# Patient Record
Sex: Female | Born: 1935 | Race: White | Hispanic: No | Marital: Single | Smoking: Former smoker
Health system: Southern US, Community
[De-identification: ages and names within clinical notes are randomized; demographics above are authoritative.]

## PROBLEM LIST (undated history)

## (undated) DIAGNOSIS — E785 Hyperlipidemia, unspecified: Secondary | ICD-10-CM

## (undated) DIAGNOSIS — N816 Rectocele: Secondary | ICD-10-CM

## (undated) DIAGNOSIS — N312 Flaccid neuropathic bladder, not elsewhere classified: Secondary | ICD-10-CM

## (undated) DIAGNOSIS — N6019 Diffuse cystic mastopathy of unspecified breast: Secondary | ICD-10-CM

## (undated) DIAGNOSIS — E079 Disorder of thyroid, unspecified: Secondary | ICD-10-CM

## (undated) DIAGNOSIS — Z8639 Personal history of other endocrine, nutritional and metabolic disease: Secondary | ICD-10-CM

## (undated) DIAGNOSIS — G4700 Insomnia, unspecified: Secondary | ICD-10-CM

## (undated) DIAGNOSIS — M8580 Other specified disorders of bone density and structure, unspecified site: Secondary | ICD-10-CM

## (undated) DIAGNOSIS — I454 Nonspecific intraventricular block: Secondary | ICD-10-CM

## (undated) DIAGNOSIS — E559 Vitamin D deficiency, unspecified: Secondary | ICD-10-CM

## (undated) DIAGNOSIS — I493 Ventricular premature depolarization: Secondary | ICD-10-CM

## (undated) DIAGNOSIS — J986 Disorders of diaphragm: Secondary | ICD-10-CM

## (undated) DIAGNOSIS — K589 Irritable bowel syndrome without diarrhea: Secondary | ICD-10-CM

## (undated) DIAGNOSIS — I10 Essential (primary) hypertension: Secondary | ICD-10-CM

## (undated) DIAGNOSIS — K219 Gastro-esophageal reflux disease without esophagitis: Secondary | ICD-10-CM

## (undated) DIAGNOSIS — M1990 Unspecified osteoarthritis, unspecified site: Secondary | ICD-10-CM

## (undated) DIAGNOSIS — G839 Paralytic syndrome, unspecified: Secondary | ICD-10-CM

## (undated) HISTORY — DX: Flaccid neuropathic bladder, not elsewhere classified: N31.2

## (undated) HISTORY — DX: Irritable bowel syndrome, unspecified: K58.9

## (undated) HISTORY — PX: EYE SURGERY: SHX253

## (undated) HISTORY — DX: Unspecified osteoarthritis, unspecified site: M19.90

## (undated) HISTORY — DX: Disorders of diaphragm: J98.6

## (undated) HISTORY — DX: Other specified disorders of bone density and structure, unspecified site: M85.80

## (undated) HISTORY — DX: Vitamin D deficiency, unspecified: E55.9

## (undated) HISTORY — DX: Hyperlipidemia, unspecified: E78.5

## (undated) HISTORY — DX: Essential (primary) hypertension: I10

## (undated) HISTORY — DX: Rectocele: N81.6

## (undated) HISTORY — DX: Nonspecific intraventricular block: I45.4

## (undated) HISTORY — DX: Paralytic syndrome, unspecified: G83.9

## (undated) HISTORY — PX: BREAST FIBROADENOMA SURGERY: SHX580

## (undated) HISTORY — DX: Gastro-esophageal reflux disease without esophagitis: K21.9

## (undated) HISTORY — DX: Disorder of thyroid, unspecified: E07.9

## (undated) HISTORY — DX: Personal history of other endocrine, nutritional and metabolic disease: Z86.39

## (undated) HISTORY — DX: Insomnia, unspecified: G47.00

## (undated) HISTORY — DX: Diffuse cystic mastopathy of unspecified breast: N60.19

## (undated) HISTORY — DX: Ventricular premature depolarization: I49.3

## (undated) HISTORY — PX: VAGINAL HYSTERECTOMY: SUR661

---

## 1968-07-20 HISTORY — PX: VESICOVAGINAL FISTULA CLOSURE W/ TAH: SUR271

## 1978-07-20 HISTORY — PX: OTHER SURGICAL HISTORY: SHX169

## 1994-07-20 HISTORY — PX: OTHER SURGICAL HISTORY: SHX169

## 1997-07-20 HISTORY — PX: CATARACT EXTRACTION: SUR2

## 2000-11-03 ENCOUNTER — Other Ambulatory Visit: Admission: RE | Admit: 2000-11-03 | Discharge: 2000-11-03 | Payer: Self-pay | Admitting: Family Medicine

## 2005-06-29 ENCOUNTER — Other Ambulatory Visit: Admission: RE | Admit: 2005-06-29 | Discharge: 2005-06-29 | Payer: Self-pay | Admitting: Family Medicine

## 2006-04-21 ENCOUNTER — Ambulatory Visit: Payer: Self-pay | Admitting: Cardiology

## 2006-05-04 ENCOUNTER — Encounter: Payer: Self-pay | Admitting: Cardiology

## 2006-05-04 ENCOUNTER — Ambulatory Visit: Payer: Self-pay

## 2006-07-28 ENCOUNTER — Ambulatory Visit: Payer: Self-pay | Admitting: Cardiology

## 2007-10-18 ENCOUNTER — Ambulatory Visit: Payer: Self-pay | Admitting: Cardiology

## 2007-10-18 LAB — CONVERTED CEMR LAB
Calcium: 9.2 mg/dL (ref 8.4–10.5)
GFR calc Af Amer: 91 mL/min
Glucose, Bld: 81 mg/dL (ref 70–99)
Potassium: 3.6 meq/L (ref 3.5–5.1)
Sodium: 141 meq/L (ref 135–145)

## 2007-10-21 ENCOUNTER — Encounter: Payer: Self-pay | Admitting: Internal Medicine

## 2007-10-21 ENCOUNTER — Ambulatory Visit: Payer: Self-pay

## 2007-11-23 ENCOUNTER — Ambulatory Visit: Payer: Self-pay | Admitting: Cardiology

## 2010-10-13 ENCOUNTER — Encounter: Payer: Self-pay | Admitting: Nurse Practitioner

## 2010-10-13 DIAGNOSIS — J45909 Unspecified asthma, uncomplicated: Secondary | ICD-10-CM | POA: Insufficient documentation

## 2010-10-13 DIAGNOSIS — K589 Irritable bowel syndrome without diarrhea: Secondary | ICD-10-CM

## 2010-10-13 DIAGNOSIS — M19019 Primary osteoarthritis, unspecified shoulder: Secondary | ICD-10-CM | POA: Insufficient documentation

## 2010-10-13 DIAGNOSIS — E669 Obesity, unspecified: Secondary | ICD-10-CM | POA: Insufficient documentation

## 2010-10-13 DIAGNOSIS — G4700 Insomnia, unspecified: Secondary | ICD-10-CM | POA: Insufficient documentation

## 2010-10-13 DIAGNOSIS — I447 Left bundle-branch block, unspecified: Secondary | ICD-10-CM

## 2010-10-13 DIAGNOSIS — N816 Rectocele: Secondary | ICD-10-CM | POA: Insufficient documentation

## 2010-10-13 DIAGNOSIS — I10 Essential (primary) hypertension: Secondary | ICD-10-CM | POA: Insufficient documentation

## 2010-10-13 DIAGNOSIS — K219 Gastro-esophageal reflux disease without esophagitis: Secondary | ICD-10-CM

## 2010-11-28 ENCOUNTER — Encounter: Payer: Self-pay | Admitting: Family Medicine

## 2010-12-02 NOTE — Assessment & Plan Note (Signed)
[facility]                            CARDIOLOGY OFFICE NOTE   NAME:Betty Keller, Menter                       MRN:          [id number]  DATE:11/23/2007                            DOB:          28-Jan-1936    PRIMARY CARE PHYSICIAN:  Dr. Gennette Pac.   REASON FOR PRESENTATION:  Evaluate the patient with dyspnea, pulmonary  edema on chest x-ray.   HISTORY OF PRESENT ILLNESS:  The patient presents for follow-up of the  above.  Her recent symptoms are extensively outlined in the October 18, 2007, note.  To evaluate her dyspnea, which is an acute on chronic  problem, I sent her for stress perfusion study which demonstrated an EF  of 69%, but no evidence of ischemia or infarct.  Today, the patient  seems to be minimizing her symptoms.  She says this is a chronic  problem.  She really does not describe the acuity that she had before.  She says she has always learned to live by limiting herself when she  gets dyspneic or when she gets chest discomfort.  She is not having any  new PND or orthopnea.  Denies any palpitation, presyncope or syncope.  She denies any new chest discomfort.  Of note, I do not see the BNP  level that I had ordered.   PAST MEDICAL HISTORY:  1. Hypertension x 10 years.  2. Paralyzed right hemidiaphragm.  3. Foot surgery.  4. Back surgery.  5. Hysterectomy.  6. Hand surgery.   ALLERGIES:  VALIUM.   MEDICATIONS:  1. Lisinopril HCT 10/12.5 daily.  2. Nortriptyline 75 mg daily.  3. Ranitidine 150 mg b.i.d..   REVIEW OF SYSTEMS:  As stated in HPI, otherwise negative for other  systems.   PHYSICAL EXAMINATION:  GENERAL:  The patient is in no distress.  VITAL SIGNS:  Blood pressure 118/78, heart rate 76 and regular, weight  196 pounds, body mass index 30.  NECK:  No jugular distention, carotid upstroke brisk and symmetric.  No  bruits, thyromegaly.  LYMPHATICS:  No adenopathy.  LUNGS:  Clear to auscultation bilaterally.  BACK:   No costovertebral angle tenderness.  CHEST:  Unremarkable.  HEART:  PMI not displaced or sustained, S1-S2 within normal.  No S3-S4,  no clicks, rubs, murmurs.  ABDOMEN:  Obese, positive bowel sounds, normal in frequency and pitch,  no bruits, rebound.  No guarding or midline pulsatile mass.  No  hepatomegaly.  No splenomegaly.  SKIN:  No rashes, no nodules.  EXTREMITIES:  Pulses 2+, no edema.   ASSESSMENT/PLAN:  1. Dyspnea.  I do not see a cardiac etiology to her complaints.  She      could have some diastolic dysfunction as she had evidence of edema      on a previous chest x-ray.  I ordered a BNP level.  If this is      elevated above 100, I will suggest Lasix at least p.r.n. as she may      have some diastolic dysfunction.  2. Hypertension.  Blood pressure is well-controlled.  She will  continue medications as listed.  3. Follow-up.  Will be back in this clinic as needed.     Rollene Rotunda, MD, [facility]  Electronically Signed    JH/MedQ  DD: 11/23/2007  DT: 11/23/2007  Job #: [id number]   cc:   Bennie Pierini

## 2010-12-02 NOTE — Assessment & Plan Note (Signed)
[facility]                            CARDIOLOGY OFFICE NOTE   NAME:GANNDebralee, Betty Keller                       MRN:          [id number]  DATE:10/18/2007                            DOB:          [birth year]    PRIMARY CARE PHYSICIAN:  Bennie Pierini, NP, [facility].   REASON FOR PRESENTATION:  Evaluate patient with dyspnea and pulmonary  edema on chest x-ray.   HISTORY OF PRESENT ILLNESS:  I have seen this patient before for  evaluation of premature ventricular contractions.  She is being referred  for a new problem.  She is 75 years old.  Her past history has included  PVCs.  She has had echocardiograms, the most recent in 2007.  This  demonstrated an EF of 65% with no significant valvular abnormalities and  no regional wall motion abnormalities.  She did have some mild left  atrial dilatation.  She had been on verapamil for some time for  management of PVCs, but somewhere along the line this was stopped.  She  is now noticing increased dyspnea.  She actually was in to see [facility] yesterday and was noted apparently to have decreased oxygen  saturations on pulse ox, so I do not have these records.  The patient  also reports that a chest x-ray demonstrated some pulmonary edema.  She  has noticed some increasing dyspnea, though this has been a chronic  problem.  She has always had to have a fan blowing on her at night to  breathe.  She sleeps on two pillows, and this has not changed.  She is  not describing any new PND.  However, she says she is getting more  dyspneic with exertion.  She does walk around grocery stores but is  otherwise limited by back pain.  She is not describing chest pressure,  neck or arm discomfort.   The patient does have a feeling like her heart is beating fast and in  her head.  She noticed this Thursday.  At the same time, she noticed  increasing shortness of breath.  She has since had a heavy  feeling in  her head.  She feels a little nauseated when she turns her head in a  certain direction.  She has not had any pre-syncope or syncope, however.  She is not in particular describing irregular heartbeats.   PAST MEDICAL HISTORY:  Hypertension x 10 years, paralyzed right  hemidiaphragm.   PAST SURGICAL HISTORY:  Foot surgery, back surgery, hysterectomy, hand  surgery.   ALLERGIES:  Valium.   MEDICATIONS:  Ranitidine 150 mg b.i.d., nortriptyline 75 mg daily,  lisinopril 10/12-1/3 daily.   SOCIAL HISTORY:  The patient is married.  She has one son.  She has one  grandson and one great-grandson.  She smoked briefly 40 years ago.   FAMILY HISTORY:  Is contributory for father dying of a enlarged heart.  She does have a brother with a bypass that she is not sure at what age  he developed his heart disease.   REVIEW OF SYSTEMS:  As stated in the HPI and positive for reflux.  Negative for all other systems.   PHYSICAL EXAMINATION:  GENERAL:  The patient is in no distress.  VITAL SIGNS:  Blood pressure 122/75, heart rate 82 and regular, weight  191 pounds, body mass index 29.  HEENT:  Eyes unremarkable.  Pupils equal, round and reactive to light.  Fundi not visualized, oral mucosa unremarkable, edentulous.  NECK:  No  jugular distention, 45 degrees, carotid upstroke brisk and symmetrical.  No bruits, thyromegaly.  LYMPHATICS:  No cervical, axillary, inguinal adenopathy.  LUNGS:  Clear to auscultation bilaterally.  BACK:  No costovertebral angle pain.  CHEST:  Unremarkable.  HEART:  PMI not displaced or sustained, S1-S2 within normal.  No S3, no  S4, no clicks, no rubs, no murmurs.  ABDOMEN:  Obese, positive bowel sounds, normal in frequency and pitch,  no bruits, no rebound, no guarding, no midline pulsatile masses, no  hepatomegaly, no splenomegaly.  SKIN:  No rashes, no nodules.  EXTREMITIES:  2+ pulses throughout, dependent rubor, no cyanosis, no  clubbing, no edema.   NEURO:  Oriented to person, place and time.  Cranial nerves II-XII  grossly intact, motor grossly intact.   EKG:  Sinus rhythm, rate 76, left axis deviation, poor anterior R wave  progression, cannot exclude old anteroseptal infarct, nonspecific  anterolateral T-wave flattening.   ASSESSMENT/PLAN:  1. Dyspnea.  This has been an acute on-chronic complaint here over the      past week.  She is very limited functional capacity, being limited      by back pain, but she is also describing some dyspnea with exertion      that is worse.  She did have apparently documented hypoxemia with      decreased O2 sats on room air and edema on a chest x-ray.  Given      this, I am going to start with the BNP level and a stress perfusion      study to exclude obstructive coronary disease.  The patient would      be able to ambulate on a treadmill, so she will have to have an      adenosine Cardiolite.  2. Hypertension.  Blood pressure is controlled.  She will continue the      medications as listed.  3. Premature ventricular contractions.  She is not particularly      complaining of an irregular heartbeat, though she has had a      pounding in her head.  I am not sure why she discontinued her      verapamil was discontinued.  When I see her back, it will be in      [place], and I will have access to her primary care office records      to review this.  Since she symptomatically did well on this      medicine for years, might consider re-initiating it.  4. Followup will be in a few weeks in [place], after the above      testing.    Rollene Rotunda, MD, [facility]  Electronically Signed   JH/MedQ  DD: 10/18/2007  DT: 10/18/2007  Job #: [id number]   cc:   Gennette Pac NP Daphine Deutscher

## 2010-12-05 ENCOUNTER — Encounter: Payer: Self-pay | Admitting: Nurse Practitioner

## 2010-12-05 NOTE — Assessment & Plan Note (Signed)
[facility]                            CARDIOLOGY OFFICE NOTE   RADLEY, TESTON                         MRN:          [id number]  DATE:07/28/2006                            DOB:          [birth year]    PRIMARY CARE PHYSICIAN:  Magnus Sinning. Rice, M.D.   REASON FOR PRESENTATION:  Patient with premature ventricular  contractions and palpitations.   HISTORY OF PRESENT ILLNESS:  The patient presents for followup of the  above.  She was having some fairly frequent ventricular ectopy when I  last saw her.  She was feeling some of this, but it was not particularly  bothersome.  An echocardiogram demonstrated a well-preserved ejection  fraction and no other significant abnormalities.  She now says that she  is really noticing these even less frequently.  She certainly had no  presyncope or syncope.  She is somewhat limited in her activities  because of some orthopedic problems.  However, she tries to do some  walking at [facility] and with this she does not develop any cardiovascular  symptoms.  She does not have any symptoms at rest.  She does not have  any chest pain, shortness of breath, PND, or orthopnea.   PAST MEDICAL HISTORY:  Hypertension, paralyzed right hemidiaphragm, foot  surgery, back surgery, hysterectomy, and hand surgery.   ALLERGIES:  VALIUM.   MEDICATIONS:  1. Ranitidine 150 mg b.i.d.  2. Nortriptyline 75 mg daily.  3. Lisinopril/hydrochlorothiazide 10/12.5 daily.   REVIEW OF SYSTEMS:  As stated in the HPI otherwise negative for other  systems.   PHYSICAL EXAMINATION:  The patient is in no distress.  Blood pressure 118/78, heart rate 76 and regular.  Weight 188 pounds.  NECK:  No jugular venous distention to 45 degrees.  Carotid upstroke  brisk and symmetrical.  No bruits or thyromegaly.  LUNGS:  Clear to auscultation.  HEART:  PMI not displaced or sustained.  S1 and S2 within normal limits.  No S3, no S4, no murmurs.  EXTREMITIES:   2+ pulses, dependent rubor.   EKG:  Sinus rhythm, rate 67, left axis deviation, poor anterior R wave  progression, nonspecific T wave flattening.   ASSESSMENT AND PLAN:  Premature ventricular contractions.  These are not  particularly symptomatic.  If they do become symptomatic I have  encouraged her to come back, at which point I probably would treat her  with verapamil.  At this point she will continue with medications as  listed.   FOLLOWUP:  I will see her back as needed.     Rollene Rotunda, MD, [facility]  Electronically Signed    JH/MedQ  DD: 07/28/2006  DT: 07/28/2006  Job #: [id number]   cc:   Magnus Sinning. Rice, M.D.

## 2010-12-05 NOTE — Assessment & Plan Note (Signed)
[facility]                              CARDIOLOGY OFFICE NOTE   NAME:Betty Keller, Betty Keller                       MRN:          [id number]  DATE:04/21/2006                            DOB:          [birth year]    PRIMARY CARE PHYSICIAN:  Magnus Sinning. Rice, M.D.   REASON FOR PRESENTATION:  Evaluate patient with frequent premature  ventricular contractions and an abnormal EKG.   HISTORY OF PRESENT ILLNESS:  Patient is referred for evaluation of the  above.  She is a 75 year old female who was seeing Dr. Dimple Casey recently and was  noted to have a heart rate of 40.  I am not sure how this was recorded, as  it may have been an automated blood pressure cuff.  She was noted on EKG to  have frequent ventricular ectopy in a trigeminy pattern.  She does have poor  anterior R wave progression on her EKG as well as a leftward axis.   The patient does not notice the palpitations frequently.  Occasionally, she  will feel a little lightheaded with slightly irregular heartbeat; however,  she has not had any frank syncope or presyncope.  She notices her heart  beating hard occasionally in her head at night; however, she does not  describe any tachy palpitations.  She is limited in getting around because  of degenerative joint disease; however, in her house, she can do some  activities of daily living without severe limitations.  She will get  dyspneic with moderate exertion such as walking outside.  She does not have  any resting shortness of breath but denies any PND or orthopnea.   PAST MEDICAL HISTORY:  Hypertension x10 years, paralyzed right  hemidiaphragm.   PAST SURGICAL HISTORY:  Foot surgery, back surgery, hysterectomy, hand  surgery.   ALLERGIES:  VALIUM.   MEDICATIONS:  1. Ranitidine 150 mg b.i.d.  2. Nortriptyline 75 mg daily.  3. Lisinopril/HCT 10/12.5 mg daily.   SOCIAL HISTORY:  Patient is married.  She has one son, one grandson, and one  great-grandson.  She smoked very briefly 40 years ago.   FAMILY HISTORY:  Contributory for her father dying of an enlarged heart.  Her brother is alive with bypass surgery.  I am not sure what age.   REVIEW OF SYSTEMS:  As stated in the HPI.  Positive for intentional 100  pound weight loss over the past year.   PHYSICAL EXAMINATION:  GENERAL:  Patient is well-appearing and in no  distress.  She has a rather flat affect.  VITAL SIGNS:  Blood pressure 120/82, heart rate 80 and regular.  Body Mass  Index 29.  HEENT:  Eyelids unremarkable.  Pupils are equal, round and reactive to  light.  Fundi within normal limits.  Oral mucosa unremarkable.  NECK:  No jugular venous distention.  Wave form within normal limits.  Carotid upstroke brisk and symmetric.  No bruits, no thyromegaly.  LYMPHATICS:  No cervical, axillary, or inguinal adenopathy.  LUNGS:  Clear to auscultation bilaterally.  BACK:  No costovertebral angle tenderness.  CHEST:  Unremarkable.  HEART:  PMI not displaced or sustained.  S1 and S2 within normal limits.  No  S3, no S4.  No clicks, no rubs, no murmurs.  ABDOMEN:  Flat, positive bowel sounds.  Normal in frequency and pitch.  No  bruits, rebound, guarding.  There are no midline pulsatile masses.  No  hepatomegaly, splenomegaly.  SKIN:  No rashes, no nodules.  EXTREMITIES:  Pulses 2+ throughout.  No cyanosis, no clubbing, no edema.  NEUROLOGIC:  Alert and oriented to person, place, and time.  Cranial nerves  II-XII grossly intact.  Motor grossly intact.   EKG:  Sinus rhythm, left axis deviation, poor anterior R wave progression,  cannot exclude an old anterior MI.  No acute ST-T wave changes.   ASSESSMENT/PLAN:  1. Premature ventricular contractions:  I have reviewed her previous EKGs.      She had multiple premature ventricular contractions in a left bundle      branch block pattern.  These may be RV outflow.  She does have some      symptoms related to them, and I might  consider verapamil.  I am getting      an echocardiogram.  She did have one of these in 2002, suggesting some      right ventricular hypertrophy.  2. Followup:  I plan to see her back at about three months or sooner if      she has an abnormality on her echocardiogram or increasing symptoms.      She knows to call me should she have more palpitations, presyncope, or      certainly any syncopal episodes.            ______________________________  Rollene Rotunda, MD, [facility]     JH/MedQ  DD:  04/21/2006  DT:  04/22/2006  Job #:  [id number]   cc:   Magnus Sinning. Rice, M.D.

## 2012-04-04 ENCOUNTER — Ambulatory Visit
Admission: RE | Admit: 2012-04-04 | Discharge: 2012-04-04 | Disposition: A | Discharge status: Home / Self Care | Payer: Medicare Other | Source: Ambulatory Visit | Attending: Family Medicine | Admitting: Family Medicine

## 2012-04-04 ENCOUNTER — Other Ambulatory Visit: Payer: Self-pay | Admitting: Family Medicine

## 2012-04-04 DIAGNOSIS — R52 Pain, unspecified: Secondary | ICD-10-CM

## 2012-04-05 ENCOUNTER — Encounter: Payer: Self-pay | Admitting: Cardiology

## 2012-04-05 ENCOUNTER — Ambulatory Visit (INDEPENDENT_AMBULATORY_CARE_PROVIDER_SITE_OTHER): Payer: Medicare Other | Admitting: Cardiology

## 2012-04-05 VITALS — BP 152/75 | HR 80 | Ht 64.0 in | Wt 225.0 lb

## 2012-04-05 DIAGNOSIS — I10 Essential (primary) hypertension: Secondary | ICD-10-CM

## 2012-04-05 DIAGNOSIS — R0989 Other specified symptoms and signs involving the circulatory and respiratory systems: Secondary | ICD-10-CM

## 2012-04-05 DIAGNOSIS — R079 Chest pain, unspecified: Secondary | ICD-10-CM

## 2012-04-05 NOTE — Patient Instructions (Addendum)
The current medical regimen is effective;  continue present plan and medications.  Your physician has requested that you have a lexiscan myoview. For further information please visit [email or web address] Please follow instruction sheet, as given.  Your physician has requested that you have a carotid duplex. This test is an ultrasound of the carotid arteries in your neck. It looks at blood flow through these arteries that supply the brain with blood. Allow one hour for this exam. There are no restrictions or special instructions.  Follow up will be based on your results.

## 2012-04-05 NOTE — Progress Notes (Signed)
HPI The patient presents for evaluation of chest discomfort and a possible TIA. I have not seen her since 2009. She had chest pain at that time with a negative stress perfusion study. She presented to her primary care doctor describing an episode of her "face drawing". She thought she had a through and her high though she didn't describe any other issues such as visual or motor disturbance. She did have a CAT scan and I reviewed these results and there was no evidence of acute event. She also describes an episode of chest discomfort. This happened about a week ago. She had never had this kind of discomfort before. He was severe. It came on at rest. It lasted about 36 hours easing gradually. There was some discomfort when she took a deep breath. She did not describe neck or arm discomfort. It was a sharp pain associated with some mild nausea but no diaphoresis. She's had no palpitations, presyncope or syncope. She has some chronic dyspnea on exertion and sleeps with her head slightly elevated but this is not new. She's had no weight gain or edema. She is limited in her abilities by back pain in joint pains.  Allergies  Allergen Reactions  . Alprazolam   . Buspar (Buspirone Hcl)   . Lorazepam     Current Outpatient Prescriptions  Medication Sig Dispense Refill  . budesonide-formoterol (SYMBICORT) 160-4.5 MCG/ACT inhaler Inhale 2 puffs into the lungs 2 (two) times daily.        . Cholecalciferol (VITAMIN D) 2000 UNITS CAPS Take by mouth. One daily       . fish oil-omega-3 fatty acids 1000 MG capsule Take 2 g by mouth daily.      Marland Kitchen lisinopril-hydrochlorothiazide (PRINZIDE,ZESTORETIC) 10-12.5 MG per tablet Take 1 tablet by mouth daily.        . nortriptyline (PAMELOR) 75 MG capsule Take 75 mg by mouth at bedtime.        . simvastatin (ZOCOR) 40 MG tablet Take 40 mg by mouth at bedtime.          Past Medical History  Diagnosis Date  . GERD (gastroesophageal reflux disease)   . Esophagitis   .  Hypertension   . IBS (irritable bowel syndrome)   . Osteoarthritis     shoulders   . Paresis     rt. pelvis   . History of obesity     lost 100 lbs  . Hypotonic bladder   . Rectocele     distal ; with outlet obstruction   . Insomnia   . Asthma   . Premature ventricular contraction   . BBB (bundle branch block)     left   . Osteopenia   . Hemidiaphragm paralysis     Past Surgical History  Procedure Date  . Cataract extraction 1999  . Right carpal tunnel surgery x3 1996  . L-s spine surgery 1980  . Vesicovaginal fistula closure w/ tah 1970    Dr. Perlie Gold    . Vaginal hysterectomy     Family History  Problem Relation Age of Onset  . Heart disease Mother     Unspecified.  Died age 47  . Breast cancer Mother   . Heart attack Father     Enlarged heart.    . Hyperlipidemia Brother   . Hypertension Brother   . Breast cancer      Family History   . Coronary artery disease Brother 29    CABG    History  Social History  . Marital Status: Single    Spouse Name: N/A    Number of Children: N/A  . Years of Education: N/A   Occupational History  . Not on file.   Social History Main Topics  . Smoking status: Former Smoker    Start date: 04/05/1972  . Smokeless tobacco: Not on file  . Alcohol Use: Not on file  . Drug Use: Not on file  . Sexually Active: Not on file   Other Topics Concern  . Not on file   Social History Narrative   Lives with husband.   One child.      ROS:  Positive for joint pains, occasional constipation.  Otherwise as stated in the HPI and negative for all other systems.  PHYSICAL EXAM BP 152/75  Pulse 80  Ht 5\' 4"  (1.626 m)  Wt 225 lb (102.059 kg)  BMI 38.62 kg/m2 GENERAL:  Well appearing HEENT:  Pupils equal round and reactive, fundi not visualized, oral mucosa unremarkable NECK:  No jugular venous distention, waveform within normal limits, carotid upstroke brisk and symmetric, questionable carotid bruits, no  thyromegaly LYMPHATICS:  No cervical, inguinal adenopathy LUNGS:  Clear to auscultation bilaterally BACK:  No CVA tenderness CHEST:  Unremarkable HEART:  PMI not displaced or sustained,S1 and S2 within normal limits, no S3, no S4, no clicks, no rubs, no murmurs ABD:  Flat, positive bowel sounds normal in frequency in pitch, no bruits, no rebound, no guarding, no midline pulsatile mass, no hepatomegaly, no splenomegaly EXT:  2 plus pulses throughout, no edema, no cyanosis no clubbing SKIN:  No rashes no nodules NEURO:  Cranial nerves II through XII grossly intact, motor grossly intact throughout PSYCH:  Cognitively intact, oriented to person place and time  EKG:  Sinus rhythm, rate 80, left axis deviation, interventricular conduction delay, no acute ST-T wave changes. 04/05/2012  ASSESSMENT AND PLAN  CHEST PAIN - Her chest pain is somewhat atypical.  However, she has significant risk factors.  She needs to be screened with a stress test but would not be a walk on the treadmill. Therefore, she will have a [employer].  HTN - The blood pressure is at target. No change in medications is indicated. We will continue with therapeutic lifestyle changes (TLC).   HYPERLIPIDEMIA - I will defer to her PCP  BRUIT - The patient is convinced she had a TIA. There is a questionable carotid bruit. She will have carotid Dopplers.

## 2012-04-11 ENCOUNTER — Ambulatory Visit (HOSPITAL_COMMUNITY): Payer: Medicare Other | Attending: Cardiology | Admitting: Radiology

## 2012-04-11 VITALS — BP 90/74 | HR 77 | Ht 64.0 in | Wt 223.0 lb

## 2012-04-11 DIAGNOSIS — R0602 Shortness of breath: Secondary | ICD-10-CM

## 2012-04-11 DIAGNOSIS — R000 Tachycardia, unspecified: Secondary | ICD-10-CM | POA: Insufficient documentation

## 2012-04-11 DIAGNOSIS — R0609 Other forms of dyspnea: Secondary | ICD-10-CM | POA: Insufficient documentation

## 2012-04-11 DIAGNOSIS — R0989 Other specified symptoms and signs involving the circulatory and respiratory systems: Secondary | ICD-10-CM | POA: Insufficient documentation

## 2012-04-11 DIAGNOSIS — R079 Chest pain, unspecified: Secondary | ICD-10-CM | POA: Insufficient documentation

## 2012-04-11 DIAGNOSIS — R002 Palpitations: Secondary | ICD-10-CM | POA: Insufficient documentation

## 2012-04-11 DIAGNOSIS — I447 Left bundle-branch block, unspecified: Secondary | ICD-10-CM | POA: Insufficient documentation

## 2012-04-11 DIAGNOSIS — Z8249 Family history of ischemic heart disease and other diseases of the circulatory system: Secondary | ICD-10-CM | POA: Insufficient documentation

## 2012-04-11 MED ORDER — ADENOSINE (DIAGNOSTIC) 3 MG/ML IV SOLN
0.5600 mg/kg | Freq: Once | INTRAVENOUS | Status: AC
  Administered 2012-04-11: 56.7 mg via INTRAVENOUS

## 2012-04-11 MED ORDER — TECHNETIUM TC 99M SESTAMIBI GENERIC - CARDIOLITE
10.8000 | Freq: Once | INTRAVENOUS | Status: AC | PRN
  Administered 2012-04-11: 11 via INTRAVENOUS

## 2012-04-11 MED ORDER — TECHNETIUM TC 99M SESTAMIBI GENERIC - CARDIOLITE
33.0000 | Freq: Once | INTRAVENOUS | Status: AC | PRN
  Administered 2012-04-11: 33 via INTRAVENOUS

## 2012-04-11 MED ORDER — ADENOSINE (DIAGNOSTIC) 3 MG/ML IV SOLN: 0.8400 mg/kg | Freq: Once | INTRAVENOUS | Status: DC

## 2012-04-11 NOTE — Progress Notes (Signed)
[facility] SITE 3 NUCLEAR MED [address] [place] [address] [phone]  Cardiology Nuclear Med Study  Betty Keller is a 76 y.o. female     MRN : [id number]     DOB: 04-May-1936  Procedure Date: 04/11/2012  Nuclear Med Background Indication for Stress Test:  Evaluation for Ischemia History:  '07 Echo:EF=65%; '09 MPS:No ischeia, EF=69% Cardiac Risk Factors: Family History - CAD, History of Smoking, LBBB, Lipids, Obesity and TIA  Symptoms:  Chest Pain (last date of chest discomfort was last night), DOE, Fatigue, Nausea, Palpitations, Rapid HR and SOB   Nuclear Pre-Procedure Caffeine/Decaff Intake:  None NPO After: 9:30pm   Lungs:  Clear. O2 Sat: 94% on room air. IV 0.9% NS with Angio Cath:  22g  IV Site: R Hand  IV Started by:  Cathlyn Parsons, RN  Chest Size (in):  42 Cup Size: D  Height: 5\' 4"  (1.626 m)  Weight:  223 lb (101.152 kg)  BMI:  Body mass index is 38.28 kg/(m^2). Tech Comments:  n/a    Nuclear Med Study 1 or 2 day study: 1 day  Stress Test Type:  Adenosine  Reading MD: Cassell Clement, MD  Order Authorizing Provider:  Rollene Rotunda, MD  Resting Radionuclide: Technetium 62m Sestamibi  Resting Radionuclide Dose: 10.8 mCi   Stress Radionuclide:  Technetium 13m Sestamibi  Stress Radionuclide Dose: 33.0 mCi           Stress Protocol Rest HR: 77 Stress HR: 93  Rest BP: 90/74 Stress BP: 114/54  Exercise Time (min): n/a METS: n/a   Predicted Max HR: 146 bpm % Max HR: 63.7 bpm Rate Pressure Product: 21308   Dose of Adenosine (mg):  n/a Dose of Lexiscan: 0.4 mg  Dose of Atropine (mg): n/a Dose of Dobutamine: n/a   Stress Test Technologist: Smiley Houseman, CMA-N  Nuclear Technologist:  Domenic Polite, CNMT     Rest Procedure:  Myocardial perfusion imaging was performed at rest 45 minutes following the intravenous administration of Technetium 95m Sestamibi  Rest ECG: LBBB with occasional PVC's/PAC's.  Stress Procedure:  The  patient received IV adenosine at 140 mcg/kg/min for 4 minutes.  There were a brief episode of 2nd degree AVB, type II, and frequent PVC's with infusion.  Technetium 78m Sestamibi was injected at the 2 minute mark and quantitative spect images were obtained after a 45 minute delay.  Stress ECG: No significant change from baseline ECG  QPS Raw Data Images:  Normal; no motion artifact; normal heart/lung ratio. Stress Images:  Normal homogeneous uptake in all areas of the myocardium. Rest Images:  There is decreased uptake in the inferior wall consistent with diaphragmatic attenuation. Subtraction (SDS):  No evidence of ischemia. Transient Ischemic Dilatation (Normal <1.22):  0.90 Lung/Heart Ratio (Normal <0.45):  0.42  Quantitative Gated Spect Images QGS EDV:  64 ml QGS ESV:  32 ml  Impression Exercise Capacity:  Adenosine study with no exercise. BP Response:  Hypotensive blood pressure response. Clinical Symptoms:  No significant symptoms noted. ECG Impression:  No significant ST segment change suggestive of ischemia. Comparison with Prior Nuclear Study: No images to compare  Overall Impression:  Normal stress nuclear study.  LV Ejection Fraction: 50%.  LV Wall Motion:  NL LV Function; NL Wall Motion  [employer]

## 2012-04-12 ENCOUNTER — Encounter (INDEPENDENT_AMBULATORY_CARE_PROVIDER_SITE_OTHER): Payer: Medicare Other

## 2012-04-12 DIAGNOSIS — I6529 Occlusion and stenosis of unspecified carotid artery: Secondary | ICD-10-CM

## 2012-04-12 DIAGNOSIS — R0989 Other specified symptoms and signs involving the circulatory and respiratory systems: Secondary | ICD-10-CM

## 2012-04-18 ENCOUNTER — Telehealth: Payer: Self-pay | Admitting: Cardiology

## 2012-04-18 NOTE — Telephone Encounter (Signed)
Pt aware of results 

## 2012-04-18 NOTE — Telephone Encounter (Signed)
[phone] calling re stress test results from last week

## 2012-10-27 ENCOUNTER — Encounter: Payer: Self-pay | Admitting: *Deleted

## 2012-11-01 ENCOUNTER — Ambulatory Visit (INDEPENDENT_AMBULATORY_CARE_PROVIDER_SITE_OTHER): Payer: Medicare Other | Admitting: Family Medicine

## 2012-11-01 VITALS — BP 140/82 | HR 70 | Temp 98.9°F | Ht 64.0 in | Wt 233.0 lb

## 2012-11-01 DIAGNOSIS — E785 Hyperlipidemia, unspecified: Secondary | ICD-10-CM

## 2012-11-01 DIAGNOSIS — L0390 Cellulitis, unspecified: Secondary | ICD-10-CM

## 2012-11-01 DIAGNOSIS — E039 Hypothyroidism, unspecified: Secondary | ICD-10-CM

## 2012-11-01 DIAGNOSIS — I10 Essential (primary) hypertension: Secondary | ICD-10-CM

## 2012-11-01 DIAGNOSIS — L0291 Cutaneous abscess, unspecified: Secondary | ICD-10-CM

## 2012-11-01 DIAGNOSIS — E559 Vitamin D deficiency, unspecified: Secondary | ICD-10-CM

## 2012-11-01 LAB — HEPATIC FUNCTION PANEL
ALT: 12 U/L (ref 0–35)
AST: 12 U/L (ref 0–37)
Albumin: 3.8 g/dL (ref 3.5–5.2)
Alkaline Phosphatase: 79 U/L (ref 39–117)
Bilirubin, Direct: 0.1 mg/dL (ref 0.0–0.3)
Indirect Bilirubin: 0.2 mg/dL (ref 0.0–0.9)
Total Bilirubin: 0.3 mg/dL (ref 0.3–1.2)
Total Protein: 6.5 g/dL (ref 6.0–8.3)

## 2012-11-01 LAB — BASIC METABOLIC PANEL WITH GFR
BUN: 20 mg/dL (ref 6–23)
CO2: 31 mEq/L (ref 19–32)
Calcium: 9.6 mg/dL (ref 8.4–10.5)
Chloride: 97 mEq/L (ref 96–112)
Creat: 1.15 mg/dL — ABNORMAL HIGH (ref 0.50–1.10)
GFR, Est African American: 54 mL/min — ABNORMAL LOW
GFR, Est Non African American: 47 mL/min — ABNORMAL LOW
Glucose, Bld: 76 mg/dL (ref 70–99)
Potassium: 5 mEq/L (ref 3.5–5.3)
Sodium: 138 mEq/L (ref 135–145)

## 2012-11-01 LAB — POCT CBC
Granulocyte percent: 70.2 %G (ref 37–80)
HCT, POC: 39.8 % (ref 37.7–47.9)
Hemoglobin: 13.3 g/dL (ref 12.2–16.2)
Lymph, poc: 1.9 (ref 0.6–3.4)
MCH, POC: 29.9 pg (ref 27–31.2)
MCHC: 33.5 g/dL (ref 31.8–35.4)
MCV: 89.4 fL (ref 80–97)
MPV: 6.2 fL (ref 0–99.8)
POC Granulocyte: 5.9 (ref 2–6.9)
POC LYMPH PERCENT: 22.2 %L (ref 10–50)
Platelet Count, POC: 353 10*3/uL (ref 142–424)
RBC: 4.5 M/uL (ref 4.04–5.48)
RDW, POC: 13 %
WBC: 8.4 10*3/uL (ref 4.6–10.2)

## 2012-11-01 LAB — TSH: TSH: 2.523 u[IU]/mL (ref 0.350–4.500)

## 2012-11-01 MED ORDER — CEPHALEXIN 500 MG PO TABS: 500.0000 mg | ORAL_TABLET | Freq: Four times a day (QID) | ORAL | Status: DC

## 2012-11-01 NOTE — Progress Notes (Signed)
Patient ID: Betty Keller, female   DOB: 1936-06-10, 77 y.o.   MRN: [id number] SUBJECTIVE: HPI: Patient is here for follow up of hypertension: denies Headache;deniesChest Pain;denies weakness;denies Shortness of Breath or Orthopnea;denies Visual changes;denies palpitations;denies cough; admits to pedal edema;denies symptoms of TIA or stroke; admits to Compliance with medications. denies Problems with medications.  Also, follow up on GERD Duration:long time admits to association with meals denies elevating the head of the bed admits to eating foods that will trigger symptoms, such as tomatoes, spicy foods, alcohol, coffee. denies not having anything to eat or drink , 3 to 4 hours before bedtime denies associated with abdominal pain denies weight loss. denies associated with food sticking in the throat or chest. denies associated with vomiting. denies associated with vomiting blood denies associated with black tarry stools. denies associated with anemia. denies associated with asthma, cough or wheezing. denies associated with choking at nights. denies trying OTC medications Medications tried: denies to having an upper endoscopy. denies a specialist evaluating and treating :     PMH/PSH: reviewed/updated in Epic  SH/FH: reviewed/updated in Epic  Allergies: reviewed/updated in Epic  Medications: reviewed/updated in Epic  Immunizations: reviewed/updated in Epic  ROS: As above in the HPI. All other systems are stable or negative.  OBJECTIVE: APPEARANCE:  Patient in no acute distress.The patient appeared well nourished and normally developed. Acyanotic. Waist: VITAL SIGNS:  SKIN: warm and  Dry without overt rashes, tattoos and scars  HEAD and Neck: without JVD, Head and scalp: normal Eyes:No scleral icterus. Fundi normal, eye movements normal. Ears: Auricle normal, canal normal, Tympanic membranes normal, insufflation normal. Nose: normal Throat: normal Neck &  thyroid: normal  CHEST & LUNGS: Chest wall: normal Lungs: Clear  CVS: Reveals the PMI to be normally located. Regular rhythm, First and Second Heart sounds are normal,  absence of murmurs, rubs or gallops. Peripheral vasculature: Radial pulses: normal Dorsal pedis pulses: normal Posterior pulses: normal  ABDOMEN:  Appearance: normal Benign,, no organomegaly, no masses, no Abdominal Aortic enlargement. No Guarding , no rebound. No Bruits. Bowel sounds: normal  RECTAL: N/A GU: N/A  EXTREMETIES:  2+ edema of the feet and ankles. Erythema of the fett and ankles in a sock distribution. No heat no redness. Both Femoral and Pedal pulses are felt deminished but palpable. Nontender. Prominent varicose veins.  MUSCULOSKELETAL:  Spine: normal Joints: intact  NEUROLOGIC: oriented to time,place and person; nonfocal. Strength is normal Sensory is normal Reflexes are normal Cranial Nerves are normal.  ASSESSMENT: Cellulitis - Plan: Cephalexin 500 MG tablet, POCT CBC  HLD (hyperlipidemia) - Plan: Hepatic function panel, NMR Lipoprofile with Lipids  HTN (hypertension) - Plan: BASIC METABOLIC PANEL WITH GFR  Unspecified vitamin D deficiency - Plan: Vitamin D 25 hydroxy  Unspecified hypothyroidism - Plan: TSH  Pedal Edema:Will treat for possible underlying cellulitis, because patient thinks that she may have some cellulitis prsent. However, her main problem is venous  Stasis with dependent stasis dermatitis. She  Really needs to elevate her legs and use compression stockings.  Venous  Stasis.  PLAN: Orders Placed This Encounter  Procedures  . TSH  . BASIC METABOLIC PANEL WITH GFR  . Hepatic function panel  . NMR Lipoprofile with Lipids  . Vitamin D 25 hydroxy  . POCT CBC   Results for orders placed in visit on 11/01/12 (from the past 24 hour(s))  POCT CBC     Status: None   Collection Time    11/01/12 10:32 AM  Result Value Range   WBC 8.4  4.6 - 10.2 K/uL    Lymph, poc 1.9  0.6 - 3.4   POC LYMPH PERCENT 22.2  10 - 50 %L   POC Granulocyte 5.9  2 - 6.9   Granulocyte percent 70.2  37 - 80 %G   RBC 4.5  4.04 - 5.48 M/uL   Hemoglobin 13.3  12.2 - 16.2 g/dL   HCT, POC 65.7  84.6 - 47.9 %   MCV 89.4  80 - 97 fL   MCH, POC 29.9  27 - 31.2 pg   MCHC 33.5  31.8 - 35.4 g/dL   RDW, POC 96.2     Platelet Count, POC 353.0  142 - 424 K/uL   MPV 6.2  0 - 99.8 fL   Meds ordered this encounter  Medications  . furosemide (LASIX) 20 MG tablet    Sig: Take 20 mg by mouth daily.  Marland Kitchen levothyroxine (SYNTHROID, LEVOTHROID) 50 MCG tablet    Sig: Take 50 mcg by mouth daily before breakfast.  . Cephalexin 500 MG tablet    Sig: Take 1 tablet (500 mg total) by mouth 4 (four) times daily.    Dispense:  40 tablet    Refill:  0   Furosemide to help with BP and  Edema. Compression knee high stockings medium range pressures.#1pair refillsx3. Await the rest of her labs. Recheck in 2 weeks. Skin care . Check feet daily  Ramey Ketcherside P. [place] Charon, M.D.

## 2012-11-01 NOTE — Progress Notes (Signed)
Quick Note:   Labs/ cbc is normal. No change in plan. Await the rest of the labs ______

## 2012-11-02 LAB — NMR LIPOPROFILE WITH LIPIDS
Cholesterol, Total: 154 mg/dL (ref <200)
HDL Particle Number: 30.6 umol/L (ref >=30.5)
HDL Size: 9.9 nm (ref >=9.2)
HDL-C: 50 mg/dL (ref >=40)
LDL (calc): 89 mg/dL (ref <100)
LDL Particle Number: 1081 nmol/L — ABNORMAL HIGH (ref <1000)
LDL Size: 20.8 nm (ref >20.5)
LP-IR Score: 34 (ref <=45)
Large HDL-P: 5.9 umol/L (ref >=4.8)
Large VLDL-P: 1.7 nmol/L (ref <=2.7)
Small LDL Particle Number: 597 nmol/L — ABNORMAL HIGH (ref <=527)
Triglycerides: 75 mg/dL (ref <150)
VLDL Size: 45.9 nm (ref <=46.6)

## 2012-11-02 LAB — VITAMIN D 25 HYDROXY (VIT D DEFICIENCY, FRACTURES): Vit D, 25-Hydroxy: 19 ng/mL — ABNORMAL LOW (ref 30–89)

## 2012-11-04 NOTE — Progress Notes (Signed)
Quick Note:  Labs abnormal. Vitamin D was very low. Will need to treat with Vit D OTC at 4000 IU daily. And Recheck in 2 months. The lipids could be a bit better.dietary interventions. ______

## 2012-11-15 ENCOUNTER — Encounter: Payer: Self-pay | Admitting: Family Medicine

## 2012-11-15 ENCOUNTER — Ambulatory Visit (INDEPENDENT_AMBULATORY_CARE_PROVIDER_SITE_OTHER): Payer: Medicare Other | Admitting: Family Medicine

## 2012-11-15 VITALS — BP 125/77 | HR 70 | Temp 98.0°F | Wt 231.4 lb

## 2012-11-15 DIAGNOSIS — R600 Localized edema: Secondary | ICD-10-CM | POA: Insufficient documentation

## 2012-11-15 DIAGNOSIS — E669 Obesity, unspecified: Secondary | ICD-10-CM

## 2012-11-15 DIAGNOSIS — H6120 Impacted cerumen, unspecified ear: Secondary | ICD-10-CM

## 2012-11-15 DIAGNOSIS — H6123 Impacted cerumen, bilateral: Secondary | ICD-10-CM

## 2012-11-15 DIAGNOSIS — I872 Venous insufficiency (chronic) (peripheral): Secondary | ICD-10-CM | POA: Insufficient documentation

## 2012-11-15 DIAGNOSIS — I83893 Varicose veins of bilateral lower extremities with other complications: Secondary | ICD-10-CM

## 2012-11-15 DIAGNOSIS — I10 Essential (primary) hypertension: Secondary | ICD-10-CM

## 2012-11-15 DIAGNOSIS — K219 Gastro-esophageal reflux disease without esophagitis: Secondary | ICD-10-CM

## 2012-11-15 DIAGNOSIS — R609 Edema, unspecified: Secondary | ICD-10-CM

## 2012-11-15 NOTE — Progress Notes (Signed)
Patient ID: Betty Keller, female   DOB: [birth year], 77 y.o.   MRN: [id number] SUBJECTIVE: HPI: 1) both ears blocked up.  2) came to recheck legs and feet.: now has compression stockings and the swelling is better and the legs are less red now. Only the toes where the compression stockings end iis red. No pain. Feels fine.  PMH/PSH: reviewed/updated in Epic  SH/FH: reviewed/updated in Epic  Allergies: reviewed/updated in Epic  Medications: reviewed/updated in Epic  Immunizations: reviewed/updated in Epic  ROS: As above in the HPI. All other systems are stable or negative.  OBJECTIVE: APPEARANCE:  Patient in no acute distress.The patient appeared well nourished and normally developed. Acyanotic. Waist: Obese White Female VITAL SIGNS:BP 125/77  Pulse 70  Temp(Src) 98 F (36.7 C) (Oral)  Wt 231 lb 6.4 oz (104.962 kg)  BMI 39.7 kg/m2   SKIN: warm and  Dry without overt rashes, tattoos and scars  HEAD and Neck: without JVD, Head and scalp: normal Eyes:No scleral icterus. Fundi normal, eye movements normal.  Ears: Auricle normal, canals bilateral impacted wax. This was disempacted manually with a cerumen spoon.   Tympanic membranes normal, insufflation normal. Nose: normal Throat: normal Neck & thyroid: normal  CHEST & LUNGS: Chest wall: normal Lungs: Clear  CVS: Reveals the PMI to be normally located. Regular rhythm, First and Second Heart sounds are normal,  absence of murmurs, rubs or gallops. Peripheral vasculature: Radial pulses: normal Dorsal pedis pulses: normal Posterior pulses: normal  ABDOMEN:  Appearance:obese Benign,, no organomegaly, no masses, no Abdominal Aortic enlargement. No Guarding , no rebound. No Bruits. Bowel sounds: normal  RECTAL: N/A GU: N/A  EXTREMETIES: edematous 1+ better with compression stockings.. Both Pedal pulses are palpable.  NEUROLOGIC: oriented to time,place and person; nonfocal.   ASSESSMENT: Cerumen impaction,  bilateral  Varicose veins of lower extremities with other complications  Pedal edema  GERD (gastroesophageal reflux disease)  HTN (hypertension)  Obesity    PLAN: Manual disempaction.  Meds ordered this encounter  Medications  . neomycin-polymyxin b-dexamethasone (MAXITROL) 3.5-10000-0.1 OINT    Sig:    No orders of the defined types were placed in this encounter.    Continue present treatment. Elevate legs and use compression stockings.  RTC in 2 months Diet exercise lose weight would improve her health.  Junius Faucett P. [place] Charon, M.D.

## 2012-11-28 ENCOUNTER — Other Ambulatory Visit: Payer: Self-pay | Admitting: *Deleted

## 2012-11-28 MED ORDER — RANITIDINE HCL 150 MG PO TABS: 150.0000 mg | ORAL_TABLET | Freq: Two times a day (BID) | ORAL | Status: DC

## 2012-11-28 MED ORDER — FUROSEMIDE 20 MG PO TABS: 20.0000 mg | ORAL_TABLET | Freq: Every day | ORAL | Status: DC

## 2013-01-03 ENCOUNTER — Ambulatory Visit: Payer: Self-pay | Admitting: Nurse Practitioner

## 2013-01-24 ENCOUNTER — Encounter: Payer: Self-pay | Admitting: Family Medicine

## 2013-01-24 ENCOUNTER — Ambulatory Visit (INDEPENDENT_AMBULATORY_CARE_PROVIDER_SITE_OTHER): Payer: Medicare Other | Admitting: Family Medicine

## 2013-01-24 VITALS — BP 137/77 | HR 75 | Temp 97.2°F | Wt 233.6 lb

## 2013-01-24 DIAGNOSIS — I10 Essential (primary) hypertension: Secondary | ICD-10-CM

## 2013-01-24 DIAGNOSIS — R609 Edema, unspecified: Secondary | ICD-10-CM

## 2013-01-24 DIAGNOSIS — R635 Abnormal weight gain: Secondary | ICD-10-CM

## 2013-01-24 DIAGNOSIS — R600 Localized edema: Secondary | ICD-10-CM

## 2013-01-24 DIAGNOSIS — K589 Irritable bowel syndrome without diarrhea: Secondary | ICD-10-CM

## 2013-01-24 DIAGNOSIS — E559 Vitamin D deficiency, unspecified: Secondary | ICD-10-CM | POA: Insufficient documentation

## 2013-01-24 DIAGNOSIS — G4700 Insomnia, unspecified: Secondary | ICD-10-CM

## 2013-01-24 DIAGNOSIS — I83893 Varicose veins of bilateral lower extremities with other complications: Secondary | ICD-10-CM

## 2013-01-24 DIAGNOSIS — E669 Obesity, unspecified: Secondary | ICD-10-CM

## 2013-01-24 DIAGNOSIS — K219 Gastro-esophageal reflux disease without esophagitis: Secondary | ICD-10-CM

## 2013-01-24 NOTE — Progress Notes (Signed)
Patient ID: Betty Keller, female   DOB: 09-May-1936, 77 y.o.   MRN: [id number] SUBJECTIVE: CC: Chief Complaint  Patient presents with  . Follow-up    2 month follow up    HPI: Here for  Follow up Patient is here for follow up of hypertension/obesity/IBS/pedal edema/Varicose Veins: denies Headache;deniesChest Pain;denies weakness;denies Shortness of Breath or Orthopnea;denies Visual changes;denies palpitations;denies cough; the pedal edema is better with compression stockings;denies symptoms of TIA or stroke; admits to Compliance with medications. denies Problems with medications.   ROS: As above in the HPI. All other systems are stable or negative.  OBJECTIVE: APPEARANCE:  Patient in no acute distress.The patient appeared well nourished and normally developed. Acyanotic. Waist: VITAL SIGNS:BP 137/77  Pulse 75  Temp(Src) 97.2 F (36.2 C) (Oral)  Wt 233 lb 9.6 oz (105.96 kg)  BMI 40.08 kg/m2 [name]  SKIN: warm and  Dry without overt rashes, tattoos and scars  HEAD and Neck: without JVD, Head and scalp: normal Eyes:No scleral icterus. Fundi normal, eye movements normal. Ears: Auricle normal, canal normal, Tympanic membranes normal, insufflation normal. Nose: normal Throat: normal Neck & thyroid: normal  CHEST & LUNGS: Chest wall: normal Lungs: Clear  CVS: Reveals the PMI to be normally located. Regular rhythm, First and Second Heart sounds are normal,  absence of murmurs, rubs or gallops. Peripheral vasculature: Radial pulses: normal Dorsal pedis pulses: normal Posterior pulses: normal  ABDOMEN:  Appearance: Obese Benign, no organomegaly, no masses, no Abdominal Aortic enlargement. No Guarding , no rebound. No Bruits. Bowel sounds: normal  RECTAL: N/A GU: N/A  EXTREMETIES: edema 1= above the knee high stockings. Which only goes up 3/4 of the legs..  MUSCULOSKELETAL:  Spine: reduced ROM  NEUROLOGIC: oriented to time,place and person;  nonfocal.   ASSESSMENT: Unspecified vitamin D deficiency - Plan: Vitamin D 25 hydroxy  Varicose veins of lower extremities with other complications  Pedal edema  Obesity  Insomnia  HTN (hypertension)  IBS (irritable bowel syndrome)  GERD (gastroesophageal reflux disease)    PLAN: Manual Rx for thigh high compression stockings: medium range pressures #1 pair refill x3 . Orders Placed This Encounter  Procedures  . Vitamin D 25 hydroxy   Continue the same  meds at this time. Would not incraese the diuretic at this time due to electrolyte disturbance risks,  Return in about 2 months (around 03/27/2013) for Recheck medical problems.  Betty Keller P. [place]esto Charon, M.D.

## 2013-01-25 LAB — VITAMIN D 25 HYDROXY (VIT D DEFICIENCY, FRACTURES): Vit D, 25-Hydroxy: 37 ng/mL (ref 30–89)

## 2013-01-27 NOTE — Progress Notes (Signed)
Quick Note:  Lab result at goal. No change in Medications for now. No Change in plans and follow up. ______ 

## 2013-03-28 ENCOUNTER — Encounter: Payer: Self-pay | Admitting: Family Medicine

## 2013-03-28 ENCOUNTER — Ambulatory Visit (INDEPENDENT_AMBULATORY_CARE_PROVIDER_SITE_OTHER): Payer: Medicare Other | Admitting: Family Medicine

## 2013-03-28 VITALS — BP 135/80 | HR 82 | Temp 98.6°F | Ht 64.0 in | Wt 231.0 lb

## 2013-03-28 DIAGNOSIS — E785 Hyperlipidemia, unspecified: Secondary | ICD-10-CM

## 2013-03-28 DIAGNOSIS — E669 Obesity, unspecified: Secondary | ICD-10-CM

## 2013-03-28 DIAGNOSIS — E039 Hypothyroidism, unspecified: Secondary | ICD-10-CM

## 2013-03-28 DIAGNOSIS — R609 Edema, unspecified: Secondary | ICD-10-CM

## 2013-03-28 DIAGNOSIS — J069 Acute upper respiratory infection, unspecified: Secondary | ICD-10-CM

## 2013-03-28 DIAGNOSIS — I83893 Varicose veins of bilateral lower extremities with other complications: Secondary | ICD-10-CM

## 2013-03-28 DIAGNOSIS — G4700 Insomnia, unspecified: Secondary | ICD-10-CM

## 2013-03-28 DIAGNOSIS — K219 Gastro-esophageal reflux disease without esophagitis: Secondary | ICD-10-CM

## 2013-03-28 DIAGNOSIS — M19019 Primary osteoarthritis, unspecified shoulder: Secondary | ICD-10-CM

## 2013-03-28 DIAGNOSIS — K589 Irritable bowel syndrome without diarrhea: Secondary | ICD-10-CM

## 2013-03-28 DIAGNOSIS — E559 Vitamin D deficiency, unspecified: Secondary | ICD-10-CM

## 2013-03-28 DIAGNOSIS — I447 Left bundle-branch block, unspecified: Secondary | ICD-10-CM

## 2013-03-28 DIAGNOSIS — R600 Localized edema: Secondary | ICD-10-CM

## 2013-03-28 DIAGNOSIS — I10 Essential (primary) hypertension: Secondary | ICD-10-CM

## 2013-03-28 NOTE — Progress Notes (Signed)
Patient ID: Betty Keller, female   DOB: [birth year], 77 y.o.   MRN: [id number] SUBJECTIVE: CC: Chief Complaint  Patient presents with  . Hypertension  . Hyperlipidemia  . Hypothyroidism  . Gastrophageal Reflux    HPI: Hoarse, congested, has the bug that's going around.no fever,  Patient is here for follow up of hyperlipidemia/HTN/hypothyroid denies Headache;denies Chest Pain;denies weakness;denies Shortness of Breath and orthopnea;denies Visual changes;denies palpitations;denies cough; pedal edema better denies symptoms of TIA or stroke;deniesClaudication symptoms. admits to Compliance with medications; denies Problems with medications.    Past Medical History  Diagnosis Date  . GERD (gastroesophageal reflux disease)   . Esophagitis   . Hypertension   . IBS (irritable bowel syndrome)   . Osteoarthritis     shoulders   . Paresis     rt. pelvis   . History of obesity     lost 100 lbs  . Hypotonic bladder   . Rectocele     distal ; with outlet obstruction   . Insomnia   . Asthma   . Premature ventricular contraction   . BBB (bundle branch block)     left   . Osteopenia   . Hemidiaphragm paralysis   . Vitamin D deficiency   . Hyperlipidemia    Past Surgical History  Procedure Laterality Date  . Cataract extraction  1999  . Right carpal tunnel surgery x3  1996  . L-s spine surgery  1980  . Vesicovaginal fistula closure w/ tah  1970    Dr. Perlie Gold    . Vaginal hysterectomy     History   Social History  . Marital Status: Single    Spouse Name: N/A    Number of Children: N/A  . Years of Education: N/A   Occupational History  . Not on file.   Social History Main Topics  . Smoking status: Former Smoker    Start date: 04/05/1972  . Smokeless tobacco: Not on file  . Alcohol Use: No  . Drug Use: No  . Sexual Activity: Not on file   Other Topics Concern  . Not on file   Social History Narrative   Lives with husband.   One child.     Family History   Problem Relation Age of Onset  . Heart disease Mother     Unspecified.  Died age 8  . Breast cancer Mother   . Heart attack Father     Enlarged heart.    . Hyperlipidemia Brother   . Hypertension Brother   . Breast cancer      Family History   . Coronary artery disease Brother 58    CABG   Current Outpatient Prescriptions on File Prior to Visit  Medication Sig Dispense Refill  . budesonide-formoterol (SYMBICORT) 160-4.5 MCG/ACT inhaler Inhale 2 puffs into the lungs 2 (two) times daily.        . Cholecalciferol (VITAMIN D) 2000 UNITS CAPS Take 4,000 Units by mouth daily. One daily      . fish oil-omega-3 fatty acids 1000 MG capsule Take 2 g by mouth daily.      . furosemide (LASIX) 20 MG tablet Take 1 tablet (20 mg total) by mouth daily.  90 tablet  0  . levothyroxine (SYNTHROID, LEVOTHROID) 50 MCG tablet Take 50 mcg by mouth daily before breakfast.      . lisinopril-hydrochlorothiazide (PRINZIDE,ZESTORETIC) 10-12.5 MG per tablet Take 1 tablet by mouth daily.        . nortriptyline (PAMELOR) 75 MG capsule  Take 75 mg by mouth at bedtime.        . ranitidine (ZANTAC) 150 MG tablet Take 1 tablet (150 mg total) by mouth 2 (two) times daily.  180 tablet  0  . simvastatin (ZOCOR) 40 MG tablet Take 40 mg by mouth at bedtime.         No current facility-administered medications on file prior to visit.   Allergies  Allergen Reactions  . Alprazolam   . Buspar [Buspirone Hcl]   . Lorazepam    Immunization History  Administered Date(s) Administered  . Influenza Whole 04/17/2010  . Td 11/18/2002   Prior to Admission medications   Medication Sig Start Date End Date Taking? Authorizing Provider  budesonide-formoterol (SYMBICORT) 160-4.5 MCG/ACT inhaler Inhale 2 puffs into the lungs 2 (two) times daily.     Yes Historical Provider, MD  Cholecalciferol (VITAMIN D) 2000 UNITS CAPS Take 4,000 Units by mouth daily. One daily   Yes Historical Provider, MD  fish oil-omega-3 fatty acids 1000 MG  capsule Take 2 g by mouth daily.   Yes Historical Provider, MD  furosemide (LASIX) 20 MG tablet Take 1 tablet (20 mg total) by mouth daily. 11/28/12  Yes Ileana Ladd, MD  levothyroxine (SYNTHROID, LEVOTHROID) 50 MCG tablet Take 50 mcg by mouth daily before breakfast.   Yes Historical Provider, MD  lisinopril-hydrochlorothiazide (PRINZIDE,ZESTORETIC) 10-12.5 MG per tablet Take 1 tablet by mouth daily.     Yes Historical Provider, MD  nortriptyline (PAMELOR) 75 MG capsule Take 75 mg by mouth at bedtime.     Yes Historical Provider, MD  ranitidine (ZANTAC) 150 MG tablet Take 1 tablet (150 mg total) by mouth 2 (two) times daily. 11/28/12  Yes Ileana Ladd, MD  simvastatin (ZOCOR) 40 MG tablet Take 40 mg by mouth at bedtime.     Yes Historical Provider, MD      ROS: As above in the HPI. All other systems are stable or negative.  OBJECTIVE: APPEARANCE:  Patient in no acute distress.The patient appeared well nourished and normally developed. Acyanotic. Waist: VITAL SIGNS:BP 135/80  Pulse 82  Temp(Src) 98.6 F (37 C) (Oral)  Ht 5\' 4"  (1.626 m)  Wt 231 lb (104.781 kg)  BMI 39.63 kg/m2 WF obese  SKIN: warm and  Dry without overt rashes, tattoos and scars  HEAD and Neck: without JVD, Head and scalp: normal Eyes:No scleral icterus. Fundi normal, eye movements normal. Ears: Auricle normal, canal normal, Tympanic membranes normal, insufflation normal. Nose: nasal congestion Throat: hoarse Neck & thyroid: normal  CHEST & LUNGS: Chest wall: normal Lungs: Clear  CVS: Reveals the PMI to be normally located. Regular rhythm, First and Second Heart sounds are normal,  absence of murmurs, rubs or gallops. Peripheral vasculature: Radial pulses: normal Dorsal pedis pulses: normal Posterior pulses: normal  ABDOMEN:  Appearance: obese Benign, no organomegaly, no masses, no Abdominal Aortic enlargement. No Guarding , no rebound. No Bruits. Bowel sounds: normal  RECTAL: N/A GU:  N/A  EXTREMETIES: trace edema  NEUROLOGIC: oriented to time,place and person; nonfocal. Strength is normal Sensory is normal Reflexes are normal Cranial Nerves are normal.  ASSESSMENT: GERD (gastroesophageal reflux disease)  HTN (hypertension) - Plan: CMP14+EGFR  IBS (irritable bowel syndrome)  Insomnia  Obesity  Osteoarthritis of shoulder, unspecified laterality  Pedal edema  Unspecified vitamin D deficiency  Varicose veins of lower extremities with other complications  Hyperlipidemia - Plan: CMP14+EGFR, NMR, lipoprofile  Bundle branch block, left  Acute upper respiratory infections of unspecified site  Hypothyroid - Plan: TSH   PLAN:  Orders Placed This Encounter  Procedures  . CMP14+EGFR  . NMR, lipoprofile  . TSH   Results for orders placed in visit on 01/24/13  VITAMIN D 25 HYDROXY      Result Value Range   Vit D, 25-Hydroxy 37  30 - 89 ng/mL   Previous labs reviewed.  Diet and exercise  Reviewed.  fluids observe viral URI. No indications for antibiotics.  Return in about 3 months (around 06/27/2013) for Recheck medical problems.  Trudie Cervantes P. [place] Charon, M.D.

## 2013-03-30 LAB — NMR, LIPOPROFILE
Cholesterol: 162 mg/dL (ref <200)
HDL Cholesterol by NMR: 61 mg/dL (ref >=40)
HDL Particle Number: 40.2 umol/L (ref >=30.5)
LDL Particle Number: 1148 nmol/L — ABNORMAL HIGH (ref <1000)
LDL Size: 21 nm (ref >20.5)
LDLC SERPL CALC-MCNC: 81 mg/dL (ref <100)
LP-IR Score: 44 (ref <=45)
Small LDL Particle Number: 574 nmol/L — ABNORMAL HIGH (ref <=527)
Triglycerides by NMR: 99 mg/dL (ref <150)

## 2013-03-30 LAB — CMP14+EGFR
ALT: 20 IU/L (ref 0–32)
AST: 21 IU/L (ref 0–40)
Albumin/Globulin Ratio: 1.7 (ref 1.1–2.5)
Albumin: 4.2 g/dL (ref 3.5–4.8)
Alkaline Phosphatase: 93 IU/L (ref 39–117)
BUN/Creatinine Ratio: 12 (ref 11–26)
BUN: 12 mg/dL (ref 8–27)
CO2: 23 mmol/L (ref 18–29)
Calcium: 9.4 mg/dL (ref 8.6–10.2)
Chloride: 95 mmol/L — ABNORMAL LOW (ref 97–108)
Creatinine, Ser: 0.99 mg/dL (ref 0.57–1.00)
GFR calc Af Amer: 64 mL/min/{1.73_m2} (ref >59)
GFR calc non Af Amer: 56 mL/min/{1.73_m2} — ABNORMAL LOW (ref >59)
Globulin, Total: 2.5 g/dL (ref 1.5–4.5)
Glucose: 86 mg/dL (ref 65–99)
Potassium: 4.4 mmol/L (ref 3.5–5.2)
Sodium: 140 mmol/L (ref 134–144)
Total Bilirubin: 0.2 mg/dL (ref 0.0–1.2)
Total Protein: 6.7 g/dL (ref 6.0–8.5)

## 2013-03-30 LAB — TSH: TSH: 2.21 u[IU]/mL (ref 0.450–4.500)

## 2013-04-03 ENCOUNTER — Encounter: Payer: Self-pay | Admitting: *Deleted

## 2013-05-30 ENCOUNTER — Encounter: Payer: Self-pay | Admitting: Cardiology

## 2013-05-30 ENCOUNTER — Ambulatory Visit (HOSPITAL_COMMUNITY): Payer: Medicare Other | Attending: Cardiology

## 2013-05-30 DIAGNOSIS — I658 Occlusion and stenosis of other precerebral arteries: Secondary | ICD-10-CM | POA: Insufficient documentation

## 2013-05-30 DIAGNOSIS — E785 Hyperlipidemia, unspecified: Secondary | ICD-10-CM | POA: Insufficient documentation

## 2013-05-30 DIAGNOSIS — Z87891 Personal history of nicotine dependence: Secondary | ICD-10-CM | POA: Insufficient documentation

## 2013-05-30 DIAGNOSIS — I10 Essential (primary) hypertension: Secondary | ICD-10-CM | POA: Insufficient documentation

## 2013-05-30 DIAGNOSIS — I6529 Occlusion and stenosis of unspecified carotid artery: Secondary | ICD-10-CM | POA: Insufficient documentation

## 2013-06-27 ENCOUNTER — Ambulatory Visit (INDEPENDENT_AMBULATORY_CARE_PROVIDER_SITE_OTHER): Payer: Medicare Other

## 2013-06-27 ENCOUNTER — Encounter: Payer: Self-pay | Admitting: Family Medicine

## 2013-06-27 ENCOUNTER — Ambulatory Visit (INDEPENDENT_AMBULATORY_CARE_PROVIDER_SITE_OTHER): Payer: Medicare Other | Admitting: Family Medicine

## 2013-06-27 VITALS — BP 128/81 | HR 85 | Temp 98.4°F | Ht 64.0 in | Wt 232.4 lb

## 2013-06-27 DIAGNOSIS — E079 Disorder of thyroid, unspecified: Secondary | ICD-10-CM | POA: Insufficient documentation

## 2013-06-27 DIAGNOSIS — M19019 Primary osteoarthritis, unspecified shoulder: Secondary | ICD-10-CM

## 2013-06-27 DIAGNOSIS — I447 Left bundle-branch block, unspecified: Secondary | ICD-10-CM

## 2013-06-27 DIAGNOSIS — R0989 Other specified symptoms and signs involving the circulatory and respiratory systems: Secondary | ICD-10-CM | POA: Insufficient documentation

## 2013-06-27 DIAGNOSIS — I10 Essential (primary) hypertension: Secondary | ICD-10-CM

## 2013-06-27 DIAGNOSIS — E785 Hyperlipidemia, unspecified: Secondary | ICD-10-CM

## 2013-06-27 DIAGNOSIS — E559 Vitamin D deficiency, unspecified: Secondary | ICD-10-CM

## 2013-06-27 DIAGNOSIS — E669 Obesity, unspecified: Secondary | ICD-10-CM

## 2013-06-27 DIAGNOSIS — R609 Edema, unspecified: Secondary | ICD-10-CM

## 2013-06-27 DIAGNOSIS — J449 Chronic obstructive pulmonary disease, unspecified: Secondary | ICD-10-CM | POA: Insufficient documentation

## 2013-06-27 DIAGNOSIS — I83893 Varicose veins of bilateral lower extremities with other complications: Secondary | ICD-10-CM

## 2013-06-27 DIAGNOSIS — K219 Gastro-esophageal reflux disease without esophagitis: Secondary | ICD-10-CM

## 2013-06-27 DIAGNOSIS — R600 Localized edema: Secondary | ICD-10-CM

## 2013-06-27 LAB — POCT CBC
Granulocyte percent: 74.6 %G (ref 37–80)
HCT, POC: 48.5 % — AB (ref 37.7–47.9)
Hemoglobin: 15.2 g/dL (ref 12.2–16.2)
Lymph, poc: 2.3 (ref 0.6–3.4)
MCH, POC: 28.7 pg (ref 27–31.2)
MCHC: 31.4 g/dL — AB (ref 31.8–35.4)
MCV: 91.4 fL (ref 80–97)
MPV: 6.9 fL (ref 0–99.8)
POC Granulocyte: 8.4 — AB (ref 2–6.9)
POC LYMPH PERCENT: 20.5 %L (ref 10–50)
Platelet Count, POC: 279 10*3/uL (ref 142–424)
RBC: 5.3 M/uL (ref 4.04–5.48)
RDW, POC: 13.8 %
WBC: 11.3 10*3/uL — AB (ref 4.6–10.2)

## 2013-06-27 MED ORDER — LEVOFLOXACIN 500 MG PO TABS: 500.0000 mg | ORAL_TABLET | Freq: Every day | ORAL | Status: DC

## 2013-06-27 MED ORDER — IPRATROPIUM-ALBUTEROL 0.5-2.5 (3) MG/3ML IN SOLN
3.0000 mL | Freq: Once | RESPIRATORY_TRACT | Status: AC
  Administered 2013-06-27: 3 mL via RESPIRATORY_TRACT

## 2013-06-27 NOTE — Progress Notes (Signed)
Patient ID: Betty Keller, female   DOB: [birth year], 77 y.o.   MRN: [id number] SUBJECTIVE: CC: Chief Complaint  Patient presents with  . Follow-up    3 MONTH FOLLOW UP  cough congestion wanted to be checked before cxr    HPI: Has had chest congestion and rattling in the chest the last couple of days with wheezing. Ex-smoker with h/o lung problems. No fever. Has inhalers but hasn't used them.no orthopnea.  Patient is here for follow up of hyperlipidemia/HTN/Obesity/GERD: denies Headache;denies Chest Pain;denies weakness;;denies Visual changes;denies palpitations;denies cough;denies pedal edema;denies symptoms of TIA or stroke;deniesClaudication symptoms. admits to Compliance with medications; denies Problems with medications.    Past Medical History  Diagnosis Date  . GERD (gastroesophageal reflux disease)   . Esophagitis   . Hypertension   . IBS (irritable bowel syndrome)   . Osteoarthritis     shoulders   . Paresis     rt. pelvis   . History of obesity     lost 100 lbs  . Hypotonic bladder   . Rectocele     distal ; with outlet obstruction   . Insomnia   . Asthma   . Premature ventricular contraction   . BBB (bundle branch block)     left   . Osteopenia   . Hemidiaphragm paralysis   . Vitamin D deficiency   . Hyperlipidemia   . Thyroid disease     hypothyroidism   Past Surgical History  Procedure Laterality Date  . Cataract extraction  1999  . Right carpal tunnel surgery x3  1996  . L-s spine surgery  1980  . Vesicovaginal fistula closure w/ tah  1970    Dr. Perlie Gold    . Vaginal hysterectomy     History   Social History  . Marital Status: Single    Spouse Name: N/A    Number of Children: N/A  . Years of Education: N/A   Occupational History  . Not on file.   Social History Main Topics  . Smoking status: Former Smoker    Start date: 04/05/1972  . Smokeless tobacco: Not on file  . Alcohol Use: No  . Drug Use: No  . Sexual Activity: Not on file    Other Topics Concern  . Not on file   Social History Narrative   Lives with husband.   One child.     Family History  Problem Relation Age of Onset  . Heart disease Mother     Unspecified.  Died age 96  . Breast cancer Mother   . Heart attack Father     Enlarged heart.    . Hyperlipidemia Brother   . Hypertension Brother   . Breast cancer      Family History   . Coronary artery disease Brother 75    CABG   Current Outpatient Prescriptions on File Prior to Visit  Medication Sig Dispense Refill  . budesonide-formoterol (SYMBICORT) 160-4.5 MCG/ACT inhaler Inhale 2 puffs into the lungs 2 (two) times daily.        . Cholecalciferol (VITAMIN D) 2000 UNITS CAPS Take 4,000 Units by mouth daily. One daily      . fish oil-omega-3 fatty acids 1000 MG capsule Take 2 g by mouth daily.      . furosemide (LASIX) 20 MG tablet Take 1 tablet (20 mg total) by mouth daily.  90 tablet  0  . levothyroxine (SYNTHROID, LEVOTHROID) 50 MCG tablet Take 50 mcg by mouth daily before breakfast.      .  lisinopril-hydrochlorothiazide (PRINZIDE,ZESTORETIC) 10-12.5 MG per tablet Take 1 tablet by mouth daily.        . nortriptyline (PAMELOR) 75 MG capsule Take 75 mg by mouth at bedtime.        . ranitidine (ZANTAC) 150 MG tablet Take 1 tablet (150 mg total) by mouth 2 (two) times daily.  180 tablet  0  . simvastatin (ZOCOR) 40 MG tablet Take 40 mg by mouth at bedtime.         No current facility-administered medications on file prior to visit.   Allergies  Allergen Reactions  . Alprazolam   . Buspar [Buspirone Hcl]   . Lorazepam    Immunization History  Administered Date(s) Administered  . Influenza Whole 04/17/2010  . Td 11/18/2002   Prior to Admission medications   Medication Sig Start Date End Date Taking? Authorizing Provider  budesonide-formoterol (SYMBICORT) 160-4.5 MCG/ACT inhaler Inhale 2 puffs into the lungs 2 (two) times daily.     Yes Historical Provider, MD  Cholecalciferol (VITAMIN D)  2000 UNITS CAPS Take 4,000 Units by mouth daily. One daily   Yes Historical Provider, MD  fish oil-omega-3 fatty acids 1000 MG capsule Take 2 g by mouth daily.   Yes Historical Provider, MD  furosemide (LASIX) 20 MG tablet Take 1 tablet (20 mg total) by mouth daily. 11/28/12  Yes Ileana Ladd, MD  levothyroxine (SYNTHROID, LEVOTHROID) 50 MCG tablet Take 50 mcg by mouth daily before breakfast.   Yes Historical Provider, MD  lisinopril-hydrochlorothiazide (PRINZIDE,ZESTORETIC) 10-12.5 MG per tablet Take 1 tablet by mouth daily.     Yes Historical Provider, MD  nortriptyline (PAMELOR) 75 MG capsule Take 75 mg by mouth at bedtime.     Yes Historical Provider, MD  ranitidine (ZANTAC) 150 MG tablet Take 1 tablet (150 mg total) by mouth 2 (two) times daily. 11/28/12  Yes Ileana Ladd, MD  simvastatin (ZOCOR) 40 MG tablet Take 40 mg by mouth at bedtime.     Yes Historical Provider, MD     ROS: As above in the HPI. All other systems are stable or negative.  OBJECTIVE: APPEARANCE:  Patient in no acute distress.The patient appeared well nourished and normally developed. Acyanotic. Waist: VITAL SIGNS:BP 128/81  Pulse 85  Temp(Src) 98.4 F (36.9 C) (Oral)  Ht 5\' 4"  (1.626 m)  Wt 232 lb 6.4 oz (105.416 kg)  BMI 39.87 kg/m2  SpO2 92%  later 92%  SKIN: warm and  Dry without overt rashes, tattoos and scars  HEAD and Neck: without JVD, Head and scalp: normal Eyes:No scleral icterus. Fundi normal, eye movements normal. Ears: Auricle normal, canal normal, Tympanic membranes normal, insufflation normal. Nose: normal Throat: normal Neck & thyroid: normal  CHEST & LUNGS: Chest wall: normal Lungs: Coarse bs, rales and crackles in bases. Rhonchi decreased bs in bases. Rattling cough  CVS: Reveals the PMI to be normally located. Regular rhythm, First and Second Heart sounds are normal,  absence of murmurs, rubs or gallops. Peripheral vasculature: Radial pulses: normal Dorsal pedis pulses:  normal Posterior pulses: normal  ABDOMEN:  Appearance: normal Benign, no organomegaly, no masses, no Abdominal Aortic enlargement. No Guarding , no rebound. No Bruits. Bowel sounds: normal  RECTAL: N/A GU: N/A  EXTREMETIES: nonedematous.  MUSCULOSKELETAL:  Spine: normal Joints: intact  NEUROLOGIC: oriented to time,place and person; nonfocal. Strength is normal Sensory is normal Reflexes are normal Cranial Nerves are normal.  Results for orders placed in visit on 06/27/13  POCT CBC  Result Value Range   WBC 11.3 (*) 4.6 - 10.2 K/uL   Lymph, poc 2.3  0.6 - 3.4   POC LYMPH PERCENT 20.5  10 - 50 %L   MID (cbc)    0 - 0.9   POC MID %    0 - 12 %M   POC Granulocyte 8.4 (*) 2 - 6.9   Granulocyte percent 74.6  37 - 80 %G   RBC 5.3  4.04 - 5.48 M/uL   Hemoglobin 15.2  12.2 - 16.2 g/dL   HCT, POC 16.1 (*) 09.6 - 47.9 %   MCV 91.4  80 - 97 fL   MCH, POC 28.7  27 - 31.2 pg   MCHC 31.4 (*) 31.8 - 35.4 g/dL   RDW, POC 04.5     Platelet Count, POC 279.0  142 - 424 K/uL   MPV 6.9  0 - 99.8 fL     ASSESSMENT:  COPD (chronic obstructive pulmonary disease) - Plan: levofloxacin (LEVAQUIN) 500 MG tablet, roflumilast (DALIRESP) 500 MCG TABS tablet  Pedal edema  Obesity  Hyperlipidemia - Plan: CMP14+EGFR, NMR, lipoprofile  HTN (hypertension) - Plan: CMP14+EGFR  Bundle branch block, left  GERD (gastroesophageal reflux disease)  Osteoarthritis of shoulder, unspecified laterality  Unspecified vitamin D deficiency  Varicose veins of lower extremities with other complications  Chest congestion - Plan: Brain natriuretic peptide, DG Chest 2 View, POCT CBC, ipratropium-albuterol (DUONEB) 0.5-2.5 (3) MG/3ML nebulizer solution 3 mL  Thyroid disease - Plan: TSH  Patient has an exacerbation of COPD/ bronchitis.  PLAN: [facility] reading (PRIMARY) by  Dr. Modesto Charon: poor inflation. Atelectasis in basis                              CLINICAL DATA: Chest congestion.  EXAM:  CHEST 2  VIEW  COMPARISON: None.  FINDINGS:  The heart size and mediastinal contours are within normal limits.  Hypoinflation of the lungs is noted. Minimal subsegmental  atelectasis is seen in both lung bases. No pleural effusion or  pneumothorax is noted. The visualized skeletal structures are  unremarkable.  IMPRESSION:  Minimal bilateral basilar subsegmental atelectasis. Hypoinflation of  the lungs.  Electronically Signed  By: Roque Lias M.D.  On: 06/27/2013 14:40    Orders Placed This Encounter  Procedures  . DG Chest 2 View    Standing Status: Future     Number of Occurrences: 1     Standing Expiration Date: 08/28/2014    Order Specific Question:  Reason for Exam (SYMPTOM  OR DIAGNOSIS REQUIRED)    Answer:  chest congestion    Order Specific Question:  Preferred imaging location?    Answer:  Internal  . CMP14+EGFR  . NMR, lipoprofile  . TSH  . Brain natriuretic peptide  . POCT CBC   Meds ordered this encounter  Medications  . ipratropium-albuterol (DUONEB) 0.5-2.5 (3) MG/3ML nebulizer solution 3 mL    Sig:   . levofloxacin (LEVAQUIN) 500 MG tablet    Sig: Take 1 tablet (500 mg total) by mouth daily.    Dispense:  10 tablet    Refill:  0  . roflumilast (DALIRESP) 500 MCG TABS tablet    Sig: Take 1 tablet (500 mcg total) by mouth daily.    Dispense:  14 tablet    Refill:  0   There are no discontinued medications. Return in about 2 days (around 06/29/2013) for Recheck medical problems.  Meggin Ola P. [place] Charon, M.D.

## 2013-06-28 ENCOUNTER — Ambulatory Visit: Payer: Medicare Other | Admitting: Family Medicine

## 2013-06-29 ENCOUNTER — Encounter: Payer: Self-pay | Admitting: Family Medicine

## 2013-06-29 ENCOUNTER — Ambulatory Visit (INDEPENDENT_AMBULATORY_CARE_PROVIDER_SITE_OTHER): Payer: Medicare Other | Admitting: Family Medicine

## 2013-06-29 VITALS — BP 127/73 | HR 85 | Temp 98.5°F | Ht 64.0 in | Wt 233.2 lb

## 2013-06-29 DIAGNOSIS — J440 Chronic obstructive pulmonary disease with acute lower respiratory infection: Secondary | ICD-10-CM

## 2013-06-29 DIAGNOSIS — R600 Localized edema: Secondary | ICD-10-CM

## 2013-06-29 DIAGNOSIS — J209 Acute bronchitis, unspecified: Secondary | ICD-10-CM | POA: Insufficient documentation

## 2013-06-29 DIAGNOSIS — I10 Essential (primary) hypertension: Secondary | ICD-10-CM

## 2013-06-29 DIAGNOSIS — E669 Obesity, unspecified: Secondary | ICD-10-CM

## 2013-06-29 DIAGNOSIS — R609 Edema, unspecified: Secondary | ICD-10-CM

## 2013-06-29 DIAGNOSIS — E785 Hyperlipidemia, unspecified: Secondary | ICD-10-CM

## 2013-06-29 DIAGNOSIS — J4489 Other specified chronic obstructive pulmonary disease: Secondary | ICD-10-CM

## 2013-06-29 DIAGNOSIS — I447 Left bundle-branch block, unspecified: Secondary | ICD-10-CM

## 2013-06-29 DIAGNOSIS — J449 Chronic obstructive pulmonary disease, unspecified: Secondary | ICD-10-CM

## 2013-06-29 LAB — CMP14+EGFR
ALT: 17 IU/L (ref 0–32)
AST: 18 IU/L (ref 0–40)
Albumin/Globulin Ratio: 1.8 (ref 1.1–2.5)
Albumin: 4.2 g/dL (ref 3.5–4.8)
Alkaline Phosphatase: 90 IU/L (ref 39–117)
BUN/Creatinine Ratio: 12 (ref 11–26)
BUN: 11 mg/dL (ref 8–27)
CO2: 26 mmol/L (ref 18–29)
Calcium: 9.5 mg/dL (ref 8.6–10.2)
Chloride: 96 mmol/L — ABNORMAL LOW (ref 97–108)
Creatinine, Ser: 0.92 mg/dL (ref 0.57–1.00)
GFR calc Af Amer: 70 mL/min/{1.73_m2} (ref >59)
GFR calc non Af Amer: 61 mL/min/{1.73_m2} (ref >59)
Globulin, Total: 2.3 g/dL (ref 1.5–4.5)
Glucose: 89 mg/dL (ref 65–99)
Potassium: 4.6 mmol/L (ref 3.5–5.2)
Sodium: 140 mmol/L (ref 134–144)
Total Bilirubin: 0.4 mg/dL (ref 0.0–1.2)
Total Protein: 6.5 g/dL (ref 6.0–8.5)

## 2013-06-29 LAB — BRAIN NATRIURETIC PEPTIDE: BNP: 91.6 pg/mL (ref 0.0–100.0)

## 2013-06-29 LAB — NMR, LIPOPROFILE
Cholesterol: 170 mg/dL (ref <200)
HDL Cholesterol by NMR: 71 mg/dL (ref >=40)
HDL Particle Number: 46.5 umol/L (ref >=30.5)
LDL Particle Number: 758 nmol/L (ref <1000)
LDL Size: 21.6 nm (ref >20.5)
LDLC SERPL CALC-MCNC: 80 mg/dL (ref <100)
LP-IR Score: 30 (ref <=45)
Small LDL Particle Number: <90 nmol/L (ref <=527)
Triglycerides by NMR: 94 mg/dL (ref <150)

## 2013-06-29 LAB — TSH: TSH: 1.91 u[IU]/mL (ref 0.450–4.500)

## 2013-06-29 NOTE — Progress Notes (Signed)
Patient ID: Betty Keller, female   DOB: [birth year], 77 y.o.   MRN: [id number] SUBJECTIVE: CC: Chief Complaint  Patient presents with  . COPD    recheck lungs    HPI: Here for follow up of COPD exacerbation and bronchitis. Husband and patient says she is tremendously improved. No fever. Using the symbicort and the daliresp. Wheezing better. Cough better. SOB better. No Chest pain.  Past Medical History  Diagnosis Date  . GERD (gastroesophageal reflux disease)   . Esophagitis   . Hypertension   . IBS (irritable bowel syndrome)   . Osteoarthritis     shoulders   . Paresis     rt. pelvis   . History of obesity     lost 100 lbs  . Hypotonic bladder   . Rectocele     distal ; with outlet obstruction   . Insomnia   . Asthma   . Premature ventricular contraction   . BBB (bundle branch block)     left   . Osteopenia   . Hemidiaphragm paralysis   . Vitamin D deficiency   . Hyperlipidemia   . Thyroid disease     hypothyroidism   Past Surgical History  Procedure Laterality Date  . Cataract extraction  1999  . Right carpal tunnel surgery x3  1996  . L-s spine surgery  1980  . Vesicovaginal fistula closure w/ tah  1970    Dr. Perlie Gold    . Vaginal hysterectomy     History   Social History  . Marital Status: Single    Spouse Name: N/A    Number of Children: N/A  . Years of Education: N/A   Occupational History  . Not on file.   Social History Main Topics  . Smoking status: Former Smoker    Start date: 04/05/1972  . Smokeless tobacco: Not on file  . Alcohol Use: No  . Drug Use: No  . Sexual Activity: Not on file   Other Topics Concern  . Not on file   Social History Narrative   Lives with husband.   One child.     Family History  Problem Relation Age of Onset  . Heart disease Mother     Unspecified.  Died age 17  . Breast cancer Mother   . Heart attack Father     Enlarged heart.    . Hyperlipidemia Brother   . Hypertension Brother   . Breast cancer       Family History   . Coronary artery disease Brother 61    CABG   Current Outpatient Prescriptions on File Prior to Visit  Medication Sig Dispense Refill  . budesonide-formoterol (SYMBICORT) 160-4.5 MCG/ACT inhaler Inhale 2 puffs into the lungs 2 (two) times daily.        . Cholecalciferol (VITAMIN D) 2000 UNITS CAPS Take 4,000 Units by mouth daily. One daily      . fish oil-omega-3 fatty acids 1000 MG capsule Take 2 g by mouth daily.      . furosemide (LASIX) 20 MG tablet Take 1 tablet (20 mg total) by mouth daily.  90 tablet  0  . levofloxacin (LEVAQUIN) 500 MG tablet Take 1 tablet (500 mg total) by mouth daily.  10 tablet  0  . levothyroxine (SYNTHROID, LEVOTHROID) 50 MCG tablet Take 50 mcg by mouth daily before breakfast.      . lisinopril-hydrochlorothiazide (PRINZIDE,ZESTORETIC) 10-12.5 MG per tablet Take 1 tablet by mouth daily.        Marland Kitchen  nortriptyline (PAMELOR) 75 MG capsule Take 75 mg by mouth at bedtime.        . ranitidine (ZANTAC) 150 MG tablet Take 1 tablet (150 mg total) by mouth 2 (two) times daily.  180 tablet  0  . roflumilast (DALIRESP) 500 MCG TABS tablet Take 1 tablet (500 mcg total) by mouth daily.  14 tablet  0  . simvastatin (ZOCOR) 40 MG tablet Take 40 mg by mouth at bedtime.         No current facility-administered medications on file prior to visit.   Allergies  Allergen Reactions  . Alprazolam   . Buspar [Buspirone Hcl]   . Lorazepam    Immunization History  Administered Date(s) Administered  . Influenza Whole 04/17/2010  . Td 11/18/2002   Prior to Admission medications   Medication Sig Start Date End Date Taking? Authorizing Provider  budesonide-formoterol (SYMBICORT) 160-4.5 MCG/ACT inhaler Inhale 2 puffs into the lungs 2 (two) times daily.     Yes Historical Provider, MD  Cholecalciferol (VITAMIN D) 2000 UNITS CAPS Take 4,000 Units by mouth daily. One daily   Yes Historical Provider, MD  fish oil-omega-3 fatty acids 1000 MG capsule Take 2 g by mouth  daily.   Yes Historical Provider, MD  furosemide (LASIX) 20 MG tablet Take 1 tablet (20 mg total) by mouth daily. 11/28/12  Yes Ileana Ladd, MD  levofloxacin (LEVAQUIN) 500 MG tablet Take 1 tablet (500 mg total) by mouth daily. 06/27/13  Yes Ileana Ladd, MD  levothyroxine (SYNTHROID, LEVOTHROID) 50 MCG tablet Take 50 mcg by mouth daily before breakfast.   Yes Historical Provider, MD  lisinopril-hydrochlorothiazide (PRINZIDE,ZESTORETIC) 10-12.5 MG per tablet Take 1 tablet by mouth daily.     Yes Historical Provider, MD  nortriptyline (PAMELOR) 75 MG capsule Take 75 mg by mouth at bedtime.     Yes Historical Provider, MD  ranitidine (ZANTAC) 150 MG tablet Take 1 tablet (150 mg total) by mouth 2 (two) times daily. 11/28/12  Yes Ileana Ladd, MD  roflumilast (DALIRESP) 500 MCG TABS tablet Take 1 tablet (500 mcg total) by mouth daily. 06/27/13  Yes Ileana Ladd, MD  simvastatin (ZOCOR) 40 MG tablet Take 40 mg by mouth at bedtime.     Yes Historical Provider, MD     ROS: As above in the HPI. All other systems are stable or negative.  OBJECTIVE: APPEARANCE:  Patient in no acute distress.The patient appeared well nourished and normally developed. Acyanotic. Waist: VITAL SIGNS:BP 127/73  Pulse 85  Temp(Src) 98.5 F (36.9 C) (Oral)  Ht 5\' 4"  (1.626 m)  Wt 233 lb 3.2 oz (105.779 kg)  BMI 40.01 kg/m2  SpO2 95% [name]  SKIN: warm and  Dry without overt rashes, tattoos and scars  HEAD and Neck: without JVD, Head and scalp: normal Eyes:No scleral icterus. Fundi normal, eye movements normal. Ears: Auricle normal, canal normal, Tympanic membranes normal, insufflation normal. Nose: normal Throat: normal Neck & thyroid: normal  CHEST & LUNGS: Chest wall: normal Lungs: Coarse breath sounds.rhonchi. Rattling cough. No rales. No wheezes.  CVS: Reveals the PMI to be normally located. Regular rhythm, First and Second Heart sounds are normal,  absence of murmurs, rubs or  gallops. Peripheral vasculature: Radial pulses: normal Dorsal pedis pulses: normal Posterior pulses: normal  ABDOMEN:  Appearance: obese Benign, no organomegaly, no masses, no Abdominal Aortic enlargement. No Guarding , no rebound. No Bruits. Bowel sounds: normal  RECTAL: N/A GU: N/A  EXTREMETIES:1 + edematous.  NEUROLOGIC:  oriented to time,place and person; nonfocal.  Results for orders placed in visit on 06/27/13  CMP14+EGFR      Result Value Range   Glucose 89  65 - 99 mg/dL   BUN 11  8 - 27 mg/dL   Creatinine, Ser 0.98  0.57 - 1.00 mg/dL   GFR calc non Af Amer 61  >59 mL/min/1.73   GFR calc Af Amer 70  >59 mL/min/1.73   BUN/Creatinine Ratio 12  11 - 26   Sodium 140  134 - 144 mmol/L   Potassium 4.6  3.5 - 5.2 mmol/L   Chloride 96 (*) 97 - 108 mmol/L   CO2 26  18 - 29 mmol/L   Calcium 9.5  8.6 - 10.2 mg/dL   Total Protein 6.5  6.0 - 8.5 g/dL   Albumin 4.2  3.5 - 4.8 g/dL   Globulin, Total 2.3  1.5 - 4.5 g/dL   Albumin/Globulin Ratio 1.8  1.1 - 2.5   Total Bilirubin 0.4  0.0 - 1.2 mg/dL   Alkaline Phosphatase 90  39 - 117 IU/L   AST 18  0 - 40 IU/L   ALT 17  0 - 32 IU/L  NMR, LIPOPROFILE      Result Value Range   LDL Particle Number 758  <1000 nmol/L   LDLC SERPL CALC-MCNC 80  <100 mg/dL   HDL Cholesterol by NMR 71  >=40 mg/dL   Triglycerides by NMR 94  <150 mg/dL   Cholesterol 119  <147 mg/dL   HDL Particle Number 82.9  >=56.2 umol/L   Small LDL Particle Number < 90  <= 527 nmol/L   LDL Size 21.6  >20.5 nm   LP-IR Score 30  <=45  TSH      Result Value Range   TSH 1.910  0.450 - 4.500 uIU/mL  BRAIN NATRIURETIC PEPTIDE      Result Value Range   BNP 91.6  0.0 - 100.0 pg/mL  POCT CBC      Result Value Range   WBC 11.3 (*) 4.6 - 10.2 K/uL   Lymph, poc 2.3  0.6 - 3.4   POC LYMPH PERCENT 20.5  10 - 50 %L   MID (cbc)    0 - 0.9   POC MID %    0 - 12 %M   POC Granulocyte 8.4 (*) 2 - 6.9   Granulocyte percent 74.6  37 - 80 %G   RBC 5.3  4.04 - 5.48 M/uL    Hemoglobin 15.2  12.2 - 16.2 g/dL   HCT, POC 13.0 (*) 86.5 - 47.9 %   MCV 91.4  80 - 97 fL   MCH, POC 28.7  27 - 31.2 pg   MCHC 31.4 (*) 31.8 - 35.4 g/dL   RDW, POC 78.4     Platelet Count, POC 279.0  142 - 424 K/uL   MPV 6.9  0 - 99.8 fL    ASSESSMENT: Obesity  Pedal edema  Bronchitis, chronic obstructive w acute bronchitis  Bundle branch block, left  COPD (chronic obstructive pulmonary disease)  HTN (hypertension)  Hyperlipidemia  PLAN:  Same medications. Continue with symbicort and daliresp. 2 weeks samples of daliresp given.  No orders of the defined types were placed in this encounter.   No orders of the defined types were placed in this encounter.   There are no discontinued medications. Return in about 2 weeks (around 07/13/2013) for Recheck medical problems.  Donell Sliwinski P. [place] Charon, M.D.

## 2013-07-12 ENCOUNTER — Ambulatory Visit (INDEPENDENT_AMBULATORY_CARE_PROVIDER_SITE_OTHER): Payer: Medicare Other | Admitting: Family Medicine

## 2013-07-12 ENCOUNTER — Encounter: Payer: Self-pay | Admitting: Family Medicine

## 2013-07-12 VITALS — BP 142/81 | HR 92 | Temp 97.4°F | Ht 64.0 in | Wt 232.0 lb

## 2013-07-12 DIAGNOSIS — J449 Chronic obstructive pulmonary disease, unspecified: Secondary | ICD-10-CM

## 2013-07-12 DIAGNOSIS — I447 Left bundle-branch block, unspecified: Secondary | ICD-10-CM

## 2013-07-12 DIAGNOSIS — I10 Essential (primary) hypertension: Secondary | ICD-10-CM

## 2013-07-12 DIAGNOSIS — J440 Chronic obstructive pulmonary disease with acute lower respiratory infection: Secondary | ICD-10-CM

## 2013-07-12 DIAGNOSIS — E785 Hyperlipidemia, unspecified: Secondary | ICD-10-CM

## 2013-07-12 NOTE — Progress Notes (Signed)
Patient ID: Betty Keller, female   DOB: 13-Apr-1936, 77 y.o.   MRN: [id number] SUBJECTIVE: CC: Chief Complaint  Patient presents with  . Follow-up    2 week follow up     HPI: COPD follow up. Better, less cough. Still a challenge to get the inhaler dose in deep, but doing it better. Breathing better Wheezes gone.. Doesn't feel SOB. No chest pain.  Past Medical History  Diagnosis Date  . GERD (gastroesophageal reflux disease)   . Esophagitis   . Hypertension   . IBS (irritable bowel syndrome)   . Osteoarthritis     shoulders   . Paresis     rt. pelvis   . History of obesity     lost 100 lbs  . Hypotonic bladder   . Rectocele     distal ; with outlet obstruction   . Insomnia   . Asthma   . Premature ventricular contraction   . BBB (bundle branch block)     left   . Osteopenia   . Hemidiaphragm paralysis   . Vitamin D deficiency   . Hyperlipidemia   . Thyroid disease     hypothyroidism   Past Surgical History  Procedure Laterality Date  . Cataract extraction  1999  . Right carpal tunnel surgery x3  1996  . L-s spine surgery  1980  . Vesicovaginal fistula closure w/ tah  1970    Dr. Perlie Gold    . Vaginal hysterectomy     History   Social History  . Marital Status: Single    Spouse Name: N/A    Number of Children: N/A  . Years of Education: N/A   Occupational History  . Not on file.   Social History Main Topics  . Smoking status: Former Smoker    Start date: 04/05/1972  . Smokeless tobacco: Not on file  . Alcohol Use: No  . Drug Use: No  . Sexual Activity: Not on file   Other Topics Concern  . Not on file   Social History Narrative   Lives with husband.   One child.     Family History  Problem Relation Age of Onset  . Heart disease Mother     Unspecified.  Died age 43  . Breast cancer Mother   . Heart attack Father     Enlarged heart.    . Hyperlipidemia Brother   . Hypertension Brother   . Breast cancer      Family History   . Coronary  artery disease Brother 33    CABG   Current Outpatient Prescriptions on File Prior to Visit  Medication Sig Dispense Refill  . budesonide-formoterol (SYMBICORT) 160-4.5 MCG/ACT inhaler Inhale 2 puffs into the lungs 2 (two) times daily.        . Cholecalciferol (VITAMIN D) 2000 UNITS CAPS Take 4,000 Units by mouth daily. One daily      . fish oil-omega-3 fatty acids 1000 MG capsule Take 2 g by mouth daily.      . furosemide (LASIX) 20 MG tablet Take 1 tablet (20 mg total) by mouth daily.  90 tablet  0  . levofloxacin (LEVAQUIN) 500 MG tablet Take 1 tablet (500 mg total) by mouth daily.  10 tablet  0  . levothyroxine (SYNTHROID, LEVOTHROID) 50 MCG tablet Take 50 mcg by mouth daily before breakfast.      . lisinopril-hydrochlorothiazide (PRINZIDE,ZESTORETIC) 10-12.5 MG per tablet Take 1 tablet by mouth daily.        Marland Kitchen  nortriptyline (PAMELOR) 75 MG capsule Take 75 mg by mouth at bedtime.        . ranitidine (ZANTAC) 150 MG tablet Take 1 tablet (150 mg total) by mouth 2 (two) times daily.  180 tablet  0  . roflumilast (DALIRESP) 500 MCG TABS tablet Take 1 tablet (500 mcg total) by mouth daily.  14 tablet  0  . simvastatin (ZOCOR) 40 MG tablet Take 40 mg by mouth at bedtime.         No current facility-administered medications on file prior to visit.   Allergies  Allergen Reactions  . Alprazolam   . Buspar [Buspirone Hcl]   . Lorazepam    Immunization History  Administered Date(s) Administered  . Influenza Whole 04/17/2010  . Td 11/18/2002   Prior to Admission medications   Medication Sig Start Date End Date Taking? Authorizing Provider  budesonide-formoterol (SYMBICORT) 160-4.5 MCG/ACT inhaler Inhale 2 puffs into the lungs 2 (two) times daily.      Historical Provider, MD  Cholecalciferol (VITAMIN D) 2000 UNITS CAPS Take 4,000 Units by mouth daily. One daily    Historical Provider, MD  fish oil-omega-3 fatty acids 1000 MG capsule Take 2 g by mouth daily.    Historical Provider, MD   furosemide (LASIX) 20 MG tablet Take 1 tablet (20 mg total) by mouth daily. 11/28/12   Ileana Ladd, MD  levofloxacin (LEVAQUIN) 500 MG tablet Take 1 tablet (500 mg total) by mouth daily. 06/27/13   Ileana Ladd, MD  levothyroxine (SYNTHROID, LEVOTHROID) 50 MCG tablet Take 50 mcg by mouth daily before breakfast.    Historical Provider, MD  lisinopril-hydrochlorothiazide (PRINZIDE,ZESTORETIC) 10-12.5 MG per tablet Take 1 tablet by mouth daily.      Historical Provider, MD  nortriptyline (PAMELOR) 75 MG capsule Take 75 mg by mouth at bedtime.      Historical Provider, MD  ranitidine (ZANTAC) 150 MG tablet Take 1 tablet (150 mg total) by mouth 2 (two) times daily. 11/28/12   Ileana Ladd, MD  roflumilast (DALIRESP) 500 MCG TABS tablet Take 1 tablet (500 mcg total) by mouth daily. 06/27/13   Ileana Ladd, MD  simvastatin (ZOCOR) 40 MG tablet Take 40 mg by mouth at bedtime.      Historical Provider, MD     ROS: As above in the HPI. All other systems are stable or negative.  OBJECTIVE: APPEARANCE:  Patient in no acute distress.The patient appeared well nourished and normally developed. Acyanotic. Waist: VITAL SIGNS:BP 142/81  Pulse 92  Temp(Src) 97.4 F (36.3 C) (Oral)  Ht 5\' 4"  (1.626 m)  Wt 232 lb (105.235 kg)  BMI 39.80 kg/m2  SpO2 93%  [name] SKIN: warm and  Dry without overt rashes, tattoos and scars  HEAD and Neck: without JVD, Head and scalp: normal Eyes:No scleral icterus. Fundi normal, eye movements normal. Ears: Auricle normal, canal normal, Tympanic membranes normal, insufflation normal. Nose: normal Throat: normal Neck & thyroid: normal  CHEST & LUNGS: Chest wall: normal Lungs: Coarse bs. Rhonchi. No wheezes.  CVS: Reveals the PMI to be normally located. Regular rhythm, First and Second Heart sounds are normal,  absence of murmurs, rubs or gallops. Peripheral vasculature: Radial pulses: normal Dorsal pedis pulses: normal Posterior pulses:  normal  ABDOMEN:  Appearance: normal Benign, no organomegaly, no masses, no Abdominal Aortic enlargement. No Guarding , no rebound. No Bruits. Bowel sounds: normal  RECTAL: N/A GU: N/A  EXTREMETIES: nonedematous.  MUSCULOSKELETAL:  Spine: normal Joints: intact  NEUROLOGIC:  oriented to time,place and person; nonfocal. Strength is normal Sensory is normal Reflexes are normal Cranial Nerves are normal.  ASSESSMENT: Bronchitis, chronic obstructive w acute bronchitis  Bundle branch block, left  COPD (chronic obstructive pulmonary disease)  HTN (hypertension)  Hyperlipidemia Better   PLAN: Continue with the same regimen. 2 samples of symbicort given. A coupon card and Rx for Daliresp 500 mcg daily #30 with Refillx1 given.   No orders of the defined types were placed in this encounter.   No orders of the defined types were placed in this encounter.   There are no discontinued medications. Return in about 3 weeks (around 08/02/2013).  Elwanda Moger P. [place] Charon, M.D.

## 2013-07-19 ENCOUNTER — Telehealth: Payer: Self-pay | Admitting: Family Medicine

## 2013-07-19 NOTE — Telephone Encounter (Signed)
Pt aware no samples of daliresp

## 2013-07-19 NOTE — Telephone Encounter (Signed)
Spoke with pt and per Dr   [place] Charon needs to get RX for daliresp Pt verbalized un derstanidng

## 2013-07-27 ENCOUNTER — Telehealth: Payer: Self-pay | Admitting: *Deleted

## 2013-07-27 NOTE — Telephone Encounter (Signed)
  I talked with Betty Keller to tell her that daliresp was covered by her ins but it still was going to cost 260.90,she sail she could not take anyway it kept her awake and made her nauseous.  Is there anything else you want to try?  She said if so please make it ger eric and make sure it is compatible with her anti-depressant

## 2013-07-30 NOTE — Telephone Encounter (Signed)
Nothing similar is generic. Just continue with her inhalers and keep the follow up and I will see what she needs next. She can come earlier if not improving.

## 2013-07-31 NOTE — Telephone Encounter (Signed)
Spoke with pt and is aware nothing similar is generic and pt has appt Jan 15 to discuss generic

## 2013-08-03 ENCOUNTER — Encounter (INDEPENDENT_AMBULATORY_CARE_PROVIDER_SITE_OTHER): Payer: Self-pay

## 2013-08-03 ENCOUNTER — Ambulatory Visit (INDEPENDENT_AMBULATORY_CARE_PROVIDER_SITE_OTHER): Payer: Medicare Other | Admitting: Family Medicine

## 2013-08-03 ENCOUNTER — Encounter: Payer: Self-pay | Admitting: Family Medicine

## 2013-08-03 VITALS — BP 148/84 | HR 91 | Temp 97.7°F | Ht 64.0 in | Wt 226.6 lb

## 2013-08-03 DIAGNOSIS — E785 Hyperlipidemia, unspecified: Secondary | ICD-10-CM

## 2013-08-03 DIAGNOSIS — Z23 Encounter for immunization: Secondary | ICD-10-CM

## 2013-08-03 DIAGNOSIS — J209 Acute bronchitis, unspecified: Secondary | ICD-10-CM

## 2013-08-03 DIAGNOSIS — J440 Chronic obstructive pulmonary disease with acute lower respiratory infection: Secondary | ICD-10-CM

## 2013-08-03 DIAGNOSIS — I10 Essential (primary) hypertension: Secondary | ICD-10-CM

## 2013-08-03 DIAGNOSIS — M19019 Primary osteoarthritis, unspecified shoulder: Secondary | ICD-10-CM

## 2013-08-03 DIAGNOSIS — K219 Gastro-esophageal reflux disease without esophagitis: Secondary | ICD-10-CM

## 2013-08-03 DIAGNOSIS — E079 Disorder of thyroid, unspecified: Secondary | ICD-10-CM

## 2013-08-03 DIAGNOSIS — R609 Edema, unspecified: Secondary | ICD-10-CM

## 2013-08-03 DIAGNOSIS — I83893 Varicose veins of bilateral lower extremities with other complications: Secondary | ICD-10-CM

## 2013-08-03 DIAGNOSIS — E559 Vitamin D deficiency, unspecified: Secondary | ICD-10-CM

## 2013-08-03 DIAGNOSIS — I447 Left bundle-branch block, unspecified: Secondary | ICD-10-CM

## 2013-08-03 DIAGNOSIS — R600 Localized edema: Secondary | ICD-10-CM

## 2013-08-03 DIAGNOSIS — E669 Obesity, unspecified: Secondary | ICD-10-CM

## 2013-08-03 DIAGNOSIS — J449 Chronic obstructive pulmonary disease, unspecified: Secondary | ICD-10-CM

## 2013-08-03 NOTE — Progress Notes (Signed)
Patient ID: Betty Keller, female   DOB: 02-16-1936, 78 y.o.   MRN: [id number] SUBJECTIVE: CC: Chief Complaint  Patient presents with  . Follow-up    reck 3 week bronchitis  not taking daliresp would like flu shot if cleared     HPI:  Follow up of COPD.  Doing so much better. No chest pain. Wheezing better. No fever. Cannot afford the Daliresp, and it makes her nauseous and nervous and shakey every time she takes it.  Also, want to get a flu shot.   Past Medical History  Diagnosis Date  . GERD (gastroesophageal reflux disease)   . Esophagitis   . Hypertension   . IBS (irritable bowel syndrome)   . Osteoarthritis     shoulders   . Paresis     rt. pelvis   . History of obesity     lost 100 lbs  . Hypotonic bladder   . Rectocele     distal ; with outlet obstruction   . Insomnia   . Asthma   . Premature ventricular contraction   . BBB (bundle branch block)     left   . Osteopenia   . Hemidiaphragm paralysis   . Vitamin D deficiency   . Hyperlipidemia   . Thyroid disease     hypothyroidism   Past Surgical History  Procedure Laterality Date  . Cataract extraction  1999  . Right carpal tunnel surgery x3  1996  . L-s spine surgery  1980  . Vesicovaginal fistula closure w/ tah  1970    Dr. Perlie Gold    . Vaginal hysterectomy     History   Social History  . Marital Status: Single    Spouse Name: N/A    Number of Children: N/A  . Years of Education: N/A   Occupational History  . Not on file.   Social History Main Topics  . Smoking status: Former Smoker    Start date: 04/05/1972  . Smokeless tobacco: Not on file  . Alcohol Use: No  . Drug Use: No  . Sexual Activity: Not on file   Other Topics Concern  . Not on file   Social History Narrative   Lives with husband.   One child.     Family History  Problem Relation Age of Onset  . Heart disease Mother     Unspecified.  Died age 41  . Breast cancer Mother   . Heart attack Father     Enlarged heart.     . Hyperlipidemia Brother   . Hypertension Brother   . Breast cancer      Family History   . Coronary artery disease Brother 90    CABG   Current Outpatient Prescriptions on File Prior to Visit  Medication Sig Dispense Refill  . budesonide-formoterol (SYMBICORT) 160-4.5 MCG/ACT inhaler Inhale 2 puffs into the lungs 2 (two) times daily.        . Cholecalciferol (VITAMIN D) 2000 UNITS CAPS Take 4,000 Units by mouth daily. One daily      . fish oil-omega-3 fatty acids 1000 MG capsule Take 2 g by mouth daily.      . furosemide (LASIX) 20 MG tablet Take 1 tablet (20 mg total) by mouth daily.  90 tablet  0  . levothyroxine (SYNTHROID, LEVOTHROID) 50 MCG tablet Take 50 mcg by mouth daily before breakfast.      . lisinopril-hydrochlorothiazide (PRINZIDE,ZESTORETIC) 10-12.5 MG per tablet Take 1 tablet by mouth daily.        Marland Kitchen  nortriptyline (PAMELOR) 75 MG capsule Take 75 mg by mouth at bedtime.        . ranitidine (ZANTAC) 150 MG tablet Take 1 tablet (150 mg total) by mouth 2 (two) times daily.  180 tablet  0  . simvastatin (ZOCOR) 40 MG tablet Take 40 mg by mouth at bedtime.        Marland Kitchen levofloxacin (LEVAQUIN) 500 MG tablet Take 1 tablet (500 mg total) by mouth daily.  10 tablet  0   No current facility-administered medications on file prior to visit.   Allergies  Allergen Reactions  . Alprazolam   . Buspar [Buspirone Hcl]   . Lorazepam    Immunization History  Administered Date(s) Administered  . Influenza Whole 04/17/2010  . Influenza,inj,Quad PF,36+ Mos 08/03/2013  . Td 11/18/2002   Prior to Admission medications   Medication Sig Start Date End Date Taking? Authorizing Provider  budesonide-formoterol (SYMBICORT) 160-4.5 MCG/ACT inhaler Inhale 2 puffs into the lungs 2 (two) times daily.      Historical Provider, MD  Cholecalciferol (VITAMIN D) 2000 UNITS CAPS Take 4,000 Units by mouth daily. One daily    Historical Provider, MD  fish oil-omega-3 fatty acids 1000 MG capsule Take 2 g by  mouth daily.    Historical Provider, MD  furosemide (LASIX) 20 MG tablet Take 1 tablet (20 mg total) by mouth daily. 11/28/12   Ileana Ladd, MD  levofloxacin (LEVAQUIN) 500 MG tablet Take 1 tablet (500 mg total) by mouth daily. 06/27/13   Ileana Ladd, MD  levothyroxine (SYNTHROID, LEVOTHROID) 50 MCG tablet Take 50 mcg by mouth daily before breakfast.    Historical Provider, MD  lisinopril-hydrochlorothiazide (PRINZIDE,ZESTORETIC) 10-12.5 MG per tablet Take 1 tablet by mouth daily.      Historical Provider, MD  nortriptyline (PAMELOR) 75 MG capsule Take 75 mg by mouth at bedtime.      Historical Provider, MD  ranitidine (ZANTAC) 150 MG tablet Take 1 tablet (150 mg total) by mouth 2 (two) times daily. 11/28/12   Ileana Ladd, MD  roflumilast (DALIRESP) 500 MCG TABS tablet Take 1 tablet (500 mcg total) by mouth daily. 06/27/13   Ileana Ladd, MD  simvastatin (ZOCOR) 40 MG tablet Take 40 mg by mouth at bedtime.      Historical Provider, MD     ROS: As above in the HPI. All other systems are stable or negative.  OBJECTIVE: APPEARANCE:  Patient in no acute distress.The patient appeared well nourished and normally developed. Acyanotic. Waist: VITAL SIGNS:BP 148/84  Pulse 91  Temp(Src) 97.7 F (36.5 C) (Oral)  Ht 5\' 4"  (1.626 m)  Wt 226 lb 9.6 oz (102.785 kg)  BMI 38.88 kg/m2  Pleasant WF, Obese.  SKIN: warm and  Dry without overt rashes, tattoos and scars  HEAD and Neck: without JVD, Head and scalp: normal Eyes:No scleral icterus. Fundi normal, eye movements normal. Ears: Auricle normal, canal normal, Tympanic membranes normal, insufflation normal. Nose: normal Throat: normal Neck & thyroid: normal  CHEST & LUNGS: Chest wall: normal Lungs: Coarse breath sounds.no wheezes now.   CVS: Reveals the PMI to be normally located. Regular rhythm, First and Second Heart sounds are normal,  absence of murmurs, rubs or gallops. Peripheral vasculature: Radial pulses:  normal Dorsal pedis pulses: normal Posterior pulses: normal  ABDOMEN:  Appearance: obese. Benign, no organomegaly, no masses, no Abdominal Aortic enlargement. No Guarding , no rebound. No Bruits. Bowel sounds: normal  RECTAL: N/A GU: N/A  EXTREMETIES: nonedematous.  MUSCULOSKELETAL:  Spine: normal Joints: intact  NEUROLOGIC: oriented to time,place and person; nonfocal. Strength is normal Sensory is normal Reflexes are normal Cranial Nerves are normal.  ASSESSMENT:  Bronchitis, chronic obstructive w acute bronchitis  Bundle branch block, left  COPD (chronic obstructive pulmonary disease)  HTN (hypertension)  Hyperlipidemia  Obesity  GERD (gastroesophageal reflux disease)  Thyroid disease  Osteoarthritis of shoulder  Unspecified vitamin D deficiency  Varicose veins of lower extremities with other complications  Need for prophylactic vaccination and inoculation against influenza  Pedal edema  PLAN: Flu shot given today. Continue present medications. 2 samples of symbicort given. Discussed the COPD. No orders of the defined types were placed in this encounter.   No orders of the defined types were placed in this encounter.   Medications Discontinued During This Encounter  Medication Reason  . roflumilast (DALIRESP) 500 MCG TABS tablet Cost of medication   Return in about 6 weeks (around 09/14/2013) for Recheck medical problems.  Tayva Easterday P. [place] CharonWong, M.D.

## 2013-09-14 ENCOUNTER — Ambulatory Visit: Payer: Medicare Other | Admitting: Family Medicine

## 2013-09-18 ENCOUNTER — Encounter: Payer: Self-pay | Admitting: Family Medicine

## 2013-09-18 ENCOUNTER — Ambulatory Visit (INDEPENDENT_AMBULATORY_CARE_PROVIDER_SITE_OTHER): Payer: Medicare Other | Admitting: Family Medicine

## 2013-09-18 VITALS — BP 144/82 | HR 87 | Temp 98.8°F | Ht 64.0 in | Wt 229.0 lb

## 2013-09-18 DIAGNOSIS — I10 Essential (primary) hypertension: Secondary | ICD-10-CM

## 2013-09-18 DIAGNOSIS — R609 Edema, unspecified: Secondary | ICD-10-CM

## 2013-09-18 DIAGNOSIS — J449 Chronic obstructive pulmonary disease, unspecified: Secondary | ICD-10-CM

## 2013-09-18 DIAGNOSIS — E039 Hypothyroidism, unspecified: Secondary | ICD-10-CM | POA: Insufficient documentation

## 2013-09-18 DIAGNOSIS — R600 Localized edema: Secondary | ICD-10-CM

## 2013-09-18 DIAGNOSIS — K219 Gastro-esophageal reflux disease without esophagitis: Secondary | ICD-10-CM

## 2013-09-18 DIAGNOSIS — E669 Obesity, unspecified: Secondary | ICD-10-CM

## 2013-09-18 DIAGNOSIS — J4489 Other specified chronic obstructive pulmonary disease: Secondary | ICD-10-CM

## 2013-09-18 DIAGNOSIS — E785 Hyperlipidemia, unspecified: Secondary | ICD-10-CM

## 2013-09-18 MED ORDER — FUROSEMIDE 20 MG PO TABS: 20.0000 mg | ORAL_TABLET | Freq: Every day | ORAL | Status: DC

## 2013-09-18 MED ORDER — SIMVASTATIN 40 MG PO TABS: 40.0000 mg | ORAL_TABLET | Freq: Every day | ORAL | Status: DC

## 2013-09-18 MED ORDER — RANITIDINE HCL 150 MG PO TABS: 150.0000 mg | ORAL_TABLET | Freq: Two times a day (BID) | ORAL | Status: DC

## 2013-09-18 MED ORDER — LISINOPRIL-HYDROCHLOROTHIAZIDE 10-12.5 MG PO TABS: 1.0000 | ORAL_TABLET | Freq: Every day | ORAL | Status: DC

## 2013-09-18 MED ORDER — NORTRIPTYLINE HCL 75 MG PO CAPS: 75.0000 mg | ORAL_CAPSULE | Freq: Every day | ORAL | Status: DC

## 2013-09-18 MED ORDER — LEVOTHYROXINE SODIUM 50 MCG PO TABS: 50.0000 ug | ORAL_TABLET | Freq: Every day | ORAL | Status: DC

## 2013-09-18 NOTE — Progress Notes (Signed)
Patient ID: Betty Keller, female   DOB: 1936/01/09, 78 y.o.   MRN: [id number] SUBJECTIVE: CC: Chief Complaint  Patient presents with  . Follow-up  . Bronchitis  . Medication Refill    HPI: Here for follow up of her COPD and breathing. She is doing great now. Upset that her PCP is leaving. Otherwise she responded to the inhaler and doing well.  Past Medical History  Diagnosis Date  . GERD (gastroesophageal reflux disease)   . Esophagitis   . Hypertension   . IBS (irritable bowel syndrome)   . Osteoarthritis     shoulders   . Paresis     rt. pelvis   . History of obesity     lost 100 lbs  . Hypotonic bladder   . Rectocele     distal ; with outlet obstruction   . Insomnia   . Asthma   . Premature ventricular contraction   . BBB (bundle branch block)     left   . Osteopenia   . Hemidiaphragm paralysis   . Vitamin D deficiency   . Hyperlipidemia   . Thyroid disease     hypothyroidism   Past Surgical History  Procedure Laterality Date  . Cataract extraction  1999  . Right carpal tunnel surgery x3  1996  . L-s spine surgery  1980  . Vesicovaginal fistula closure w/ tah  1970    Dr. Joneen Caraway    . Vaginal hysterectomy     History   Social History  . Marital Status: Single    Spouse Name: N/A    Number of Children: N/A  . Years of Education: N/A   Occupational History  . Not on file.   Social History Main Topics  . Smoking status: Former Smoker    Start date: 04/05/1972  . Smokeless tobacco: Not on file  . Alcohol Use: No  . Drug Use: No  . Sexual Activity: Not on file   Other Topics Concern  . Not on file   Social History Narrative   Lives with husband.   One child.     Family History  Problem Relation Age of Onset  . Heart disease Mother     Unspecified.  Died age 104  . Breast cancer Mother   . Heart attack Father     Enlarged heart.    . Hyperlipidemia Brother   . Hypertension Brother   . Breast cancer      Family History   . Coronary  artery disease Brother 68    CABG   Current Outpatient Prescriptions on File Prior to Visit  Medication Sig Dispense Refill  . budesonide-formoterol (SYMBICORT) 160-4.5 MCG/ACT inhaler Inhale 2 puffs into the lungs 2 (two) times daily.        . Cholecalciferol (VITAMIN D) 2000 UNITS CAPS Take 4,000 Units by mouth daily. One daily      . fish oil-omega-3 fatty acids 1000 MG capsule Take 2 g by mouth daily.       No current facility-administered medications on file prior to visit.   Allergies  Allergen Reactions  . Alprazolam   . Buspar [Buspirone Hcl]   . Lorazepam    Immunization History  Administered Date(s) Administered  . Influenza Whole 04/17/2010  . Influenza,inj,Quad PF,36+ Mos 08/03/2013  . Td 11/18/2002   Prior to Admission medications   Medication Sig Start Date End Date Taking? Authorizing Provider  budesonide-formoterol (SYMBICORT) 160-4.5 MCG/ACT inhaler Inhale 2 puffs into the lungs 2 (two) times  daily.     Yes Historical Provider, MD  Cholecalciferol (VITAMIN D) 2000 UNITS CAPS Take 4,000 Units by mouth daily. One daily   Yes Historical Provider, MD  fish oil-omega-3 fatty acids 1000 MG capsule Take 2 g by mouth daily.   Yes Historical Provider, MD  furosemide (LASIX) 20 MG tablet Take 1 tablet (20 mg total) by mouth daily. 09/18/13  Yes Vernie Shanks, MD  levothyroxine (SYNTHROID, LEVOTHROID) 50 MCG tablet Take 1 tablet (50 mcg total) by mouth daily before breakfast. 09/18/13  Yes Vernie Shanks, MD  lisinopril-hydrochlorothiazide (PRINZIDE,ZESTORETIC) 10-12.5 MG per tablet Take 1 tablet by mouth daily. 09/18/13  Yes Vernie Shanks, MD  nortriptyline (PAMELOR) 75 MG capsule Take 1 capsule (75 mg total) by mouth at bedtime. 09/18/13  Yes Vernie Shanks, MD  ranitidine (ZANTAC) 150 MG tablet Take 1 tablet (150 mg total) by mouth 2 (two) times daily. 09/18/13  Yes Vernie Shanks, MD  simvastatin (ZOCOR) 40 MG tablet Take 1 tablet (40 mg total) by mouth at bedtime. 09/18/13  Yes  Vernie Shanks, MD     ROS: As above in the HPI. All other systems are stable or negative.  OBJECTIVE: APPEARANCE:  Patient in no acute distress.The patient appeared well nourished and normally developed. Acyanotic. Waist: VITAL SIGNS:BP 144/82  Pulse 87  Temp(Src) 98.8 F (37.1 C) (Oral)  Ht $R'5\' 4"'CE$  (1.626 m)  Wt 229 lb (103.874 kg)  BMI 39.29 kg/m2 Obese WM  SKIN: warm and  Dry without overt rashes, tattoos and scars  HEAD and Neck: without JVD, Head and scalp: normal Eyes:No scleral icterus. Fundi normal, eye movements normal. Ears: Auricle normal, canal normal, Tympanic membranes normal, insufflation normal. Nose: normal Throat: normal Neck & thyroid: normal  CHEST & LUNGS: Chest wall: normal Lungs: Clear today  CVS: Reveals the PMI to be normally located. Regular rhythm, First and Second Heart sounds are normal,  absence of murmurs, rubs or gallops. Peripheral vasculature: Radial pulses: normal  ABDOMEN:  Appearance: Obese Benign, no organomegaly, no masses, no Abdominal Aortic enlargement. No Guarding , no rebound. No Bruits. Bowel sounds: normal  RECTAL: N/A GU: N/A  EXTREMETIES: nonedematous.  MUSCULOSKELETAL:  Spine: normal Joints: intact  NEUROLOGIC: oriented to time,place and person; nonfocal. Results for orders placed in visit on 06/27/13  [id number]+EGFR      Result Value Ref Range   Glucose 89  65 - 99 mg/dL   BUN 11  8 - 27 mg/dL   Creatinine, Ser 0.92  0.57 - 1.00 mg/dL   GFR calc non Af Amer 61  >59 mL/min/1.73   GFR calc Af Amer 70  >59 mL/min/1.73   BUN/Creatinine Ratio 12  11 - 26   Sodium 140  134 - 144 mmol/L   Potassium 4.6  3.5 - 5.2 mmol/L   Chloride 96 (*) 97 - 108 mmol/L   CO2 26  18 - 29 mmol/L   Calcium 9.5  8.6 - 10.2 mg/dL   Total Protein 6.5  6.0 - 8.5 g/dL   Albumin 4.2  3.5 - 4.8 g/dL   Globulin, Total 2.3  1.5 - 4.5 g/dL   Albumin/Globulin Ratio 1.8  1.1 - 2.5   Total Bilirubin 0.4  0.0 - 1.2 mg/dL   Alkaline  Phosphatase 90  39 - 117 IU/L   AST 18  0 - 40 IU/L   ALT 17  0 - 32 IU/L  NMR, LIPOPROFILE      Result Value Ref  Range   LDL Particle Number [id number]  <1000 nmol/L   LDLC SERPL CALC-MCNC 80  <100 mg/dL   HDL Cholesterol by NMR 71  >=40 mg/dL   Triglycerides by NMR 94  <150 mg/dL   Cholesterol 170  <200 mg/dL   HDL Particle Number [id number]  >=30.5 umol/L   Small LDL Particle Number < 90  <= 527 nmol/L   LDL Size 21.6  >20.5 nm   LP-IR Score 30  <=45  TSH      Result Value Ref Range   TSH 1.910  0.450 - 4.500 uIU/mL  BRAIN NATRIURETIC PEPTIDE      Result Value Ref Range   BNP 91.6  0.0 - 100.0 pg/mL  POCT CBC      Result Value Ref Range   WBC 11.3 (*) 4.6 - 10.2 K/uL   Lymph, poc 2.3  0.6 - 3.4   POC LYMPH PERCENT 20.5  10 - 50 %L   MID (cbc)    0 - 0.9   POC MID %    0 - 12 %M   POC Granulocyte 8.4 (*) 2 - 6.9   Granulocyte percent 74.6  37 - 80 %G   RBC 5.3  4.04 - 5.48 M/uL   Hemoglobin 15.2  12.2 - 16.2 g/dL   HCT, POC 48.5 (*) 37.7 - 47.9 %   MCV 91.4  80 - 97 fL   MCH, POC 28.7  27 - 31.2 pg   MCHC 31.4 (*) 31.8 - 35.4 g/dL   RDW, POC 13.8     Platelet Count, POC 279.0  142 - 424 K/uL   MPV 6.9  0 - 99.8 fL    ASSESSMENT:  COPD (chronic obstructive pulmonary disease)  Pedal edema  Obesity  Hyperlipidemia - Plan: simvastatin (ZOCOR) 40 MG tablet  HTN (hypertension) - Plan: lisinopril-hydrochlorothiazide (PRINZIDE,ZESTORETIC) 10-12.5 MG per tablet, furosemide (LASIX) 20 MG tablet  GERD (gastroesophageal reflux disease) - Plan: ranitidine (ZANTAC) 150 MG tablet  Hypothyroid - Plan: levothyroxine (SYNTHROID, LEVOTHROID) 50 MCG tablet  PLAN: Continue inhaler.  Same medications. Diet exercise weight loss. No orders of the defined types were placed in this encounter.   Meds ordered this encounter  Medications  . lisinopril-hydrochlorothiazide (PRINZIDE,ZESTORETIC) 10-12.5 MG per tablet    Sig: Take 1 tablet by mouth daily.    Dispense:  90 tablet    Refill:  3   . ranitidine (ZANTAC) 150 MG tablet    Sig: Take 1 tablet (150 mg total) by mouth 2 (two) times daily.    Dispense:  180 tablet    Refill:  3  . simvastatin (ZOCOR) 40 MG tablet    Sig: Take 1 tablet (40 mg total) by mouth at bedtime.    Dispense:  90 tablet    Refill:  3  . furosemide (LASIX) 20 MG tablet    Sig: Take 1 tablet (20 mg total) by mouth daily.    Dispense:  90 tablet    Refill:  3  . levothyroxine (SYNTHROID, LEVOTHROID) 50 MCG tablet    Sig: Take 1 tablet (50 mcg total) by mouth daily before breakfast.    Dispense:  90 tablet    Refill:  3  . nortriptyline (PAMELOR) 75 MG capsule    Sig: Take 1 capsule (75 mg total) by mouth at bedtime.    Dispense:  90 capsule    Refill:  3   Medications Discontinued During This Encounter  Medication Reason  . levofloxacin (LEVAQUIN) 500  MG tablet Completed Course  . lisinopril-hydrochlorothiazide (PRINZIDE,ZESTORETIC) 10-12.5 MG per tablet Reorder  . ranitidine (ZANTAC) 150 MG tablet Reorder  . simvastatin (ZOCOR) 40 MG tablet Reorder  . furosemide (LASIX) 20 MG tablet Reorder  . levothyroxine (SYNTHROID, LEVOTHROID) 50 MCG tablet Reorder  . nortriptyline (PAMELOR) 75 MG capsule Reorder   Return in about 6 weeks (around 10/30/2013) for Recheck medical problems.labs as well.  Ashante Snelling P. Jacelyn Grip, M.D.

## 2013-10-31 ENCOUNTER — Ambulatory Visit (INDEPENDENT_AMBULATORY_CARE_PROVIDER_SITE_OTHER): Payer: Medicare Other | Admitting: Family Medicine

## 2013-10-31 ENCOUNTER — Encounter: Payer: Self-pay | Admitting: Family Medicine

## 2013-10-31 VITALS — BP 125/74 | HR 86 | Temp 98.2°F | Ht 64.0 in | Wt 227.6 lb

## 2013-10-31 DIAGNOSIS — K219 Gastro-esophageal reflux disease without esophagitis: Secondary | ICD-10-CM

## 2013-10-31 DIAGNOSIS — R600 Localized edema: Secondary | ICD-10-CM

## 2013-10-31 DIAGNOSIS — R609 Edema, unspecified: Secondary | ICD-10-CM

## 2013-10-31 DIAGNOSIS — E559 Vitamin D deficiency, unspecified: Secondary | ICD-10-CM

## 2013-10-31 DIAGNOSIS — I83893 Varicose veins of bilateral lower extremities with other complications: Secondary | ICD-10-CM

## 2013-10-31 DIAGNOSIS — E669 Obesity, unspecified: Secondary | ICD-10-CM

## 2013-10-31 DIAGNOSIS — E079 Disorder of thyroid, unspecified: Secondary | ICD-10-CM

## 2013-10-31 DIAGNOSIS — J440 Chronic obstructive pulmonary disease with acute lower respiratory infection: Secondary | ICD-10-CM

## 2013-10-31 DIAGNOSIS — E785 Hyperlipidemia, unspecified: Secondary | ICD-10-CM

## 2013-10-31 DIAGNOSIS — I447 Left bundle-branch block, unspecified: Secondary | ICD-10-CM

## 2013-10-31 DIAGNOSIS — I10 Essential (primary) hypertension: Secondary | ICD-10-CM

## 2013-10-31 DIAGNOSIS — J449 Chronic obstructive pulmonary disease, unspecified: Secondary | ICD-10-CM

## 2013-10-31 DIAGNOSIS — M19019 Primary osteoarthritis, unspecified shoulder: Secondary | ICD-10-CM

## 2013-10-31 NOTE — Patient Instructions (Signed)
DASH Diet  The DASH diet stands for "Dietary Approaches to Stop Hypertension." It is a healthy eating plan that has been shown to reduce high blood pressure (hypertension) in as little as 14 days, while also possibly providing other significant health benefits. These other health benefits include reducing the risk of breast cancer after menopause and reducing the risk of type 2 diabetes, heart disease, colon cancer, and stroke. Health benefits also include weight loss and slowing kidney failure in patients with chronic kidney disease.   DIET GUIDELINES  · Limit salt (sodium). Your diet should contain less than 1500 mg of sodium daily.  · Limit refined or processed carbohydrates. Your diet should include mostly whole grains. Desserts and added sugars should be used sparingly.  · Include small amounts of heart-healthy fats. These types of fats include nuts, oils, and tub margarine. Limit saturated and trans fats. These fats have been shown to be harmful in the body.  CHOOSING FOODS   The following food groups are based on a 2000 calorie diet. See your Registered Dietitian for individual calorie needs.  Grains and Grain Products (6 to 8 servings daily)  · Eat More Often: Whole-wheat bread, brown rice, whole-grain or wheat pasta, quinoa, popcorn without added fat or salt (air popped).  · Eat Less Often: White bread, white pasta, white rice, cornbread.  Vegetables (4 to 5 servings daily)  · Eat More Often: Fresh, frozen, and canned vegetables. Vegetables may be raw, steamed, roasted, or grilled with a minimal amount of fat.  · Eat Less Often/Avoid: Creamed or fried vegetables. Vegetables in a cheese sauce.  Fruit (4 to 5 servings daily)  · Eat More Often: All fresh, canned (in natural juice), or frozen fruits. Dried fruits without added sugar. One hundred percent fruit juice (½ cup [237 mL] daily).  · Eat Less Often: Dried fruits with added sugar. Canned fruit in light or heavy syrup.  Lean Meats, Fish, and Poultry (2  servings or less daily. One serving is 3 to 4 oz [85-114 g]).  · Eat More Often: Ninety percent or leaner ground beef, tenderloin, sirloin. Round cuts of beef, chicken breast, turkey breast. All fish. Grill, bake, or broil your meat. Nothing should be fried.  · Eat Less Often/Avoid: Fatty cuts of meat, turkey, or chicken leg, thigh, or wing. Fried cuts of meat or fish.  Dairy (2 to 3 servings)  · Eat More Often: Low-fat or fat-free milk, low-fat plain or light yogurt, reduced-fat or part-skim cheese.  · Eat Less Often/Avoid: Milk (whole, 2%). Whole milk yogurt. Full-fat cheeses.  Nuts, Seeds, and Legumes (4 to 5 servings per week)  · Eat More Often: All without added salt.  · Eat Less Often/Avoid: Salted nuts and seeds, canned beans with added salt.  Fats and Sweets (limited)  · Eat More Often: Vegetable oils, tub margarines without trans fats, sugar-free gelatin. Mayonnaise and salad dressings.  · Eat Less Often/Avoid: Coconut oils, palm oils, butter, stick margarine, cream, half and half, cookies, candy, pie.  FOR MORE INFORMATION  The Dash Diet Eating Plan: www.dashdiet.org  Document Released: 06/25/2011 Document Revised: 09/28/2011 Document Reviewed: 06/25/2011  [employer]® Patient Information ©2014 [employer].

## 2013-10-31 NOTE — Progress Notes (Signed)
Patient ID: Betty Keller, female   DOB: Sep 24, 1935, 78 y.o.   MRN: [id number] SUBJECTIVE: CC: Chief Complaint  Patient presents with  . Follow-up    6 week follow up chronic problems. alert and oriented x 3.    HPI: Patient is here for follow up of hyperlipidemia/HTN/Obesity/hypothyroidism/COPD: denies Headache;denies Chest Pain;denies weakness;denies Shortness of Breath and orthopnea;denies Visual changes;denies palpitations;denies cough;denies pedal edema;denies symptoms of TIA or stroke;deniesClaudication symptoms. admits to Compliance with medications; denies Problems with medications.  Doing well now.stable no complaints. No depression. No falls. Trying to walk more for exercise   Past Medical History  Diagnosis Date  . GERD (gastroesophageal reflux disease)   . Esophagitis   . Hypertension   . IBS (irritable bowel syndrome)   . Osteoarthritis     shoulders   . Paresis     rt. pelvis   . History of obesity     lost 100 lbs  . Hypotonic bladder   . Rectocele     distal ; with outlet obstruction   . Insomnia   . Asthma   . Premature ventricular contraction   . BBB (bundle branch block)     left   . Osteopenia   . Hemidiaphragm paralysis   . Vitamin D deficiency   . Hyperlipidemia   . Thyroid disease     hypothyroidism   Past Surgical History  Procedure Laterality Date  . Cataract extraction  1999  . Right carpal tunnel surgery x3  1996  . L-s spine surgery  1980  . Vesicovaginal fistula closure w/ tah  1970    Dr. Perlie Gold    . Vaginal hysterectomy     History   Social History  . Marital Status: Single    Spouse Name: N/A    Number of Children: N/A  . Years of Education: N/A   Occupational History  . Not on file.   Social History Main Topics  . Smoking status: Former Smoker    Start date: 04/05/1972  . Smokeless tobacco: Not on file  . Alcohol Use: No  . Drug Use: No  . Sexual Activity: Not on file   Other Topics Concern  . Not on file    Social History Narrative   Lives with husband.   One child.     Family History  Problem Relation Age of Onset  . Heart disease Mother     Unspecified.  Died age 44  . Breast cancer Mother   . Heart attack Father     Enlarged heart.    . Hyperlipidemia Brother   . Hypertension Brother   . Breast cancer      Family History   . Coronary artery disease Brother 51    CABG   Current Outpatient Prescriptions on File Prior to Visit  Medication Sig Dispense Refill  . budesonide-formoterol (SYMBICORT) 160-4.5 MCG/ACT inhaler Inhale 2 puffs into the lungs 2 (two) times daily.        . Cholecalciferol (VITAMIN D) 2000 UNITS CAPS Take 4,000 Units by mouth daily. One daily      . fish oil-omega-3 fatty acids 1000 MG capsule Take 2 g by mouth daily.      . furosemide (LASIX) 20 MG tablet Take 1 tablet (20 mg total) by mouth daily.  90 tablet  3  . levothyroxine (SYNTHROID, LEVOTHROID) 50 MCG tablet Take 1 tablet (50 mcg total) by mouth daily before breakfast.  90 tablet  3  . lisinopril-hydrochlorothiazide (PRINZIDE,ZESTORETIC) 10-12.5 MG per  tablet Take 1 tablet by mouth daily.  90 tablet  3  . nortriptyline (PAMELOR) 75 MG capsule Take 1 capsule (75 mg total) by mouth at bedtime.  90 capsule  3  . ranitidine (ZANTAC) 150 MG tablet Take 1 tablet (150 mg total) by mouth 2 (two) times daily.  180 tablet  3  . simvastatin (ZOCOR) 40 MG tablet Take 1 tablet (40 mg total) by mouth at bedtime.  90 tablet  3   No current facility-administered medications on file prior to visit.   Allergies  Allergen Reactions  . Alprazolam   . Buspar [Buspirone Hcl]   . Lorazepam    Immunization History  Administered Date(s) Administered  . Influenza Whole 04/17/2010  . Influenza,inj,Quad PF,36+ Mos 08/03/2013  . Td 11/18/2002   Prior to Admission medications   Medication Sig Start Date End Date Taking? Authorizing Provider  budesonide-formoterol (SYMBICORT) 160-4.5 MCG/ACT inhaler Inhale 2 puffs into the  lungs 2 (two) times daily.      Historical Provider, MD  Cholecalciferol (VITAMIN D) 2000 UNITS CAPS Take 4,000 Units by mouth daily. One daily    Historical Provider, MD  fish oil-omega-3 fatty acids 1000 MG capsule Take 2 g by mouth daily.    Historical Provider, MD  furosemide (LASIX) 20 MG tablet Take 1 tablet (20 mg total) by mouth daily. 09/18/13   Vernie Shanks, MD  levothyroxine (SYNTHROID, LEVOTHROID) 50 MCG tablet Take 1 tablet (50 mcg total) by mouth daily before breakfast. 09/18/13   Vernie Shanks, MD  lisinopril-hydrochlorothiazide (PRINZIDE,ZESTORETIC) 10-12.5 MG per tablet Take 1 tablet by mouth daily. 09/18/13   Vernie Shanks, MD  nortriptyline (PAMELOR) 75 MG capsule Take 1 capsule (75 mg total) by mouth at bedtime. 09/18/13   Vernie Shanks, MD  ranitidine (ZANTAC) 150 MG tablet Take 1 tablet (150 mg total) by mouth 2 (two) times daily. 09/18/13   Vernie Shanks, MD  simvastatin (ZOCOR) 40 MG tablet Take 1 tablet (40 mg total) by mouth at bedtime. 09/18/13   Vernie Shanks, MD     ROS: As above in the HPI. All other systems are stable or negative.  OBJECTIVE: APPEARANCE:  Patient in no acute distress.The patient appeared well nourished and normally developed. Acyanotic. Waist: VITAL SIGNS:BP 125/74  Pulse 86  Temp(Src) 98.2 F (36.8 C) (Oral)  Ht $R'5\' 4"'XQ$  (1.626 m)  Wt 227 lb 9.6 oz (103.239 kg)  BMI 39.05 kg/m2  SpO2 92% [name]   SKIN: warm and  Dry without overt rashes, tattoos and scars  HEAD and Neck: without JVD, Head and scalp: normal Eyes:No scleral icterus. Fundi normal, eye movements normal. Ears: Auricle normal, canal normal, Tympanic membranes normal, insufflation normal. Nose: normal Throat: normal Neck & thyroid: normal  CHEST & LUNGS: Chest wall: normal Lungs: Clear  CVS: Reveals the PMI to be normally located. Regular rhythm, First and Second Heart sounds are normal,  absence of murmurs, rubs or gallops. Peripheral vasculature: Radial pulses:  normal Dorsal pedis pulses: normal Posterior pulses: normal  ABDOMEN:  Appearance: Obese Benign, no organomegaly, no masses, no Abdominal Aortic enlargement. No Guarding , no rebound. No Bruits. Bowel sounds: normal  RECTAL: N/A GU: N/A  EXTREMETIES: nonedematous.  MUSCULOSKELETAL:  Spine: normal Joints: intact  NEUROLOGIC: oriented to time,place and person; nonfocal. Strength is normal Sensory is normal Reflexes are normal Cranial Nerves are normal. Results for orders placed in visit on 06/27/13  CMP14+EGFR      Result Value Ref  Range   Glucose 89  65 - 99 mg/dL   BUN 11  8 - 27 mg/dL   Creatinine, Ser 8.31  0.57 - 1.00 mg/dL   GFR calc non Af Amer 61  >59 mL/min/1.73   GFR calc Af Amer 70  >59 mL/min/1.73   BUN/Creatinine Ratio 12  11 - 26   Sodium 140  134 - 144 mmol/L   Potassium 4.6  3.5 - 5.2 mmol/L   Chloride 96 (*) 97 - 108 mmol/L   CO2 26  18 - 29 mmol/L   Calcium 9.5  8.6 - 10.2 mg/dL   Total Protein 6.5  6.0 - 8.5 g/dL   Albumin 4.2  3.5 - 4.8 g/dL   Globulin, Total 2.3  1.5 - 4.5 g/dL   Albumin/Globulin Ratio 1.8  1.1 - 2.5   Total Bilirubin 0.4  0.0 - 1.2 mg/dL   Alkaline Phosphatase 90  39 - 117 IU/L   AST 18  0 - 40 IU/L   ALT 17  0 - 32 IU/L  NMR, LIPOPROFILE      Result Value Ref Range   LDL Particle Number [id number]  <1000 nmol/L   LDLC SERPL CALC-MCNC 80  <100 mg/dL   HDL Cholesterol by NMR 71  >=40 mg/dL   Triglycerides by NMR 94  <150 mg/dL   Cholesterol 517  <616 mg/dL   HDL Particle Number [id number]  >=71.0 umol/L   Small LDL Particle Number < 90  <= 527 nmol/L   LDL Size 21.6  >20.5 nm   LP-IR Score 30  <=45  TSH      Result Value Ref Range   TSH 1.910  0.450 - 4.500 uIU/mL  BRAIN NATRIURETIC PEPTIDE      Result Value Ref Range   BNP 91.6  0.0 - 100.0 pg/mL  POCT CBC      Result Value Ref Range   WBC 11.3 (*) 4.6 - 10.2 K/uL   Lymph, poc 2.3  0.6 - 3.4   POC LYMPH PERCENT 20.5  10 - 50 %L   MID (cbc)    0 - 0.9   POC MID %    0 - 12 %M    POC Granulocyte 8.4 (*) 2 - 6.9   Granulocyte percent 74.6  37 - 80 %G   RBC 5.3  4.04 - 5.48 M/uL   Hemoglobin 15.2  12.2 - 16.2 g/dL   HCT, POC 62.6 (*) 94.8 - 47.9 %   MCV 91.4  80 - 97 fL   MCH, POC 28.7  27 - 31.2 pg   MCHC 31.4 (*) 31.8 - 35.4 g/dL   RDW, POC 54.6     Platelet Count, POC 279.0  142 - 424 K/uL   MPV 6.9  0 - 99.8 fL    ASSESSMENT:  Pedal edema  Obesity  Hyperlipidemia - Plan: CMP14+EGFR, Lipid panel  COPD (chronic obstructive pulmonary disease)  HTN (hypertension)  Bundle branch block, left  Bronchitis, chronic obstructive w acute bronchitis  Unspecified vitamin D deficiency - Plan: Vit D  25 hydroxy (rtn osteoporosis monitoring)  Thyroid disease - Plan: TSH  Varicose veins of lower extremities with other complications  Osteoarthritis of shoulder  GERD (gastroesophageal reflux disease) stable PLAN: DASH diet in the AVS.  Weight reduction , keep active.  No change oin medications. Orders Placed This Encounter  Procedures  . CMP14+EGFR  . Lipid panel  . TSH  . Vit D  25 hydroxy (rtn osteoporosis monitoring)  No orders of the defined types were placed in this encounter.   There are no discontinued medications. Return in about 3 months (around 01/30/2014) for Recheck medical problems.  Embry Huss P. Jacelyn Grip, M.D.

## 2013-11-01 ENCOUNTER — Other Ambulatory Visit (INDEPENDENT_AMBULATORY_CARE_PROVIDER_SITE_OTHER): Payer: Medicare Other

## 2013-11-01 ENCOUNTER — Telehealth: Payer: Self-pay | Admitting: *Deleted

## 2013-11-01 DIAGNOSIS — R7989 Other specified abnormal findings of blood chemistry: Secondary | ICD-10-CM

## 2013-11-01 NOTE — Progress Notes (Signed)
Patient came in for re-check potassium 

## 2013-11-01 NOTE — Telephone Encounter (Signed)
[facility] called and patients potassium was elevated at 6.8 and patient see wong. I am going to call patient and have her come in for STAT repeat. I called patient and advised her she needed her to come in today for a repeat and she said she will have her husband bring her this evening or first thing in the am. I am going to route this to wong and see if he recommends anything else

## 2013-11-02 ENCOUNTER — Encounter (INDEPENDENT_AMBULATORY_CARE_PROVIDER_SITE_OTHER): Payer: Medicare Other | Admitting: Family Medicine

## 2013-11-02 ENCOUNTER — Encounter: Payer: Self-pay | Admitting: Family Medicine

## 2013-11-02 NOTE — Telephone Encounter (Signed)
Appt scheduled for 2:20 today with Dr. [place] CharonWong.  Left detailed message that it is very important that we see her today at 2:20.

## 2013-11-02 NOTE — Telephone Encounter (Signed)
Patient needs to be seen today to have an EKG and a stat repeat of the potassium Bring all medications at office visit.

## 2013-11-02 NOTE — Progress Notes (Signed)
Patient ID: Betty GunnelsBarbara A Dobrowski, female   DOB: 15-Aug-1935, 78 y.o.   MRN: [id number] potassium was reported very high and patient was to come in . However,the lab was repeated and the potassium was normal. Patient was not needed to be seen. Carlean Crowl P. [place] CharonWong, M.D.

## 2013-11-04 LAB — CMP14+EGFR
ALT: 20 IU/L (ref 0–32)
AST: 29 IU/L (ref 0–40)
Albumin/Globulin Ratio: 1.4 (ref 1.1–2.5)
Albumin: 4.2 g/dL (ref 3.5–4.8)
Alkaline Phosphatase: 98 IU/L (ref 39–117)
BUN/Creatinine Ratio: 13 (ref 11–26)
BUN: 15 mg/dL (ref 8–27)
CO2: 22 mmol/L (ref 18–29)
Calcium: 9.9 mg/dL (ref 8.7–10.3)
Chloride: 97 mmol/L (ref 97–108)
Creatinine, Ser: 1.12 mg/dL — ABNORMAL HIGH (ref 0.57–1.00)
GFR calc Af Amer: 56 mL/min/{1.73_m2} — ABNORMAL LOW (ref >59)
GFR calc non Af Amer: 48 mL/min/{1.73_m2} — ABNORMAL LOW (ref >59)
Globulin, Total: 3.1 g/dL (ref 1.5–4.5)
Glucose: 86 mg/dL (ref 65–99)
Potassium: 6.8 mmol/L (ref 3.5–5.2)
Sodium: 143 mmol/L (ref 134–144)
Total Bilirubin: 0.4 mg/dL (ref 0.0–1.2)
Total Protein: 7.3 g/dL (ref 6.0–8.5)

## 2013-11-04 LAB — LIPID PANEL
Chol/HDL Ratio: 2.4 ratio units (ref 0.0–4.4)
Cholesterol, Total: 166 mg/dL (ref 100–199)
HDL: 68 mg/dL (ref >39)
LDL Calculated: 78 mg/dL (ref 0–99)
Triglycerides: 98 mg/dL (ref 0–149)
VLDL Cholesterol Cal: 20 mg/dL (ref 5–40)

## 2013-11-04 LAB — TSH

## 2013-11-04 LAB — VITAMIN D 25 HYDROXY (VIT D DEFICIENCY, FRACTURES): Vit D, 25-Hydroxy: 22.1 ng/mL — ABNORMAL LOW (ref 30.0–100.0)

## 2013-11-10 LAB — POTASSIUM: Potassium: 4 mmol/L (ref 3.5–5.2)

## 2013-11-22 LAB — TSH: TSH: 2.75 u[IU]/mL (ref 0.450–4.500)

## 2013-11-22 LAB — SPECIMEN STATUS REPORT

## 2013-11-22 NOTE — Progress Notes (Signed)
Quick Note:  Call patient. Labs TSH normal level No change in plan. ______

## 2013-12-05 DIAGNOSIS — Z0289 Encounter for other administrative examinations: Secondary | ICD-10-CM

## 2014-01-05 ENCOUNTER — Encounter: Payer: Self-pay | Admitting: *Deleted

## 2014-01-11 ENCOUNTER — Ambulatory Visit (INDEPENDENT_AMBULATORY_CARE_PROVIDER_SITE_OTHER): Payer: Medicare Other | Admitting: Family Medicine

## 2014-01-11 ENCOUNTER — Encounter: Payer: Self-pay | Admitting: Family Medicine

## 2014-01-11 VITALS — BP 138/69 | HR 94 | Temp 96.7°F | Ht 64.0 in | Wt 237.0 lb

## 2014-01-11 DIAGNOSIS — L02419 Cutaneous abscess of limb, unspecified: Secondary | ICD-10-CM

## 2014-01-11 DIAGNOSIS — R609 Edema, unspecified: Secondary | ICD-10-CM

## 2014-01-11 DIAGNOSIS — L03119 Cellulitis of unspecified part of limb: Secondary | ICD-10-CM

## 2014-01-11 MED ORDER — NYSTATIN-TRIAMCINOLONE 100000-0.1 UNIT/GM-% EX OINT: 1.0000 "application " | TOPICAL_OINTMENT | Freq: Two times a day (BID) | CUTANEOUS | Status: DC

## 2014-01-11 MED ORDER — DOXYCYCLINE HYCLATE 100 MG PO TABS: 100.0000 mg | ORAL_TABLET | Freq: Two times a day (BID) | ORAL | Status: DC

## 2014-01-11 MED ORDER — METOLAZONE 2.5 MG PO TABS: ORAL_TABLET | ORAL | Status: DC

## 2014-01-11 NOTE — Progress Notes (Signed)
   Subjective:    Patient ID: Betty Keller, female    DOB: 07-16-1936, 78 y.o.   MRN: [id number]  HPI  This 78 y.o. female presents for evaluation of bilateral lower extremity edema and bilateral rash or erythema in the feet.  Review of Systems C/o swelling and rash   No chest pain, SOB, HA, dizziness, vision change, N/V, diarrhea, constipation, dysuria, urinary urgency or frequency, myalgias, arthralgias   Objective:   Physical Exam Vital signs noted  Well developed well nourished female.  HEENT - Head atraumatic Normocephalic Respiratory - Lungs CTA bilateral Cardiac - RRR S1 and S2 without murmur Skin - bilateral pre-tibial and pedal edema and bilateral feet erythematous       Assessment & Plan:  Edema - Plan: metolazone (ZAROXOLYN) 2.5 MG tablet, doxycycline (VIBRA-TABS) 100 MG tablet, nystatin-triamcinolone ointment (MYCOLOG)  Cellulitis of lower extremity, unspecified laterality - Plan: metolazone (ZAROXOLYN) 2.5 MG tablet, doxycycline (VIBRA-TABS) 100 MG tablet, nystatin-triamcinolone ointment (MYCOLOG)  Deatra CanterWilliam J Oxford FNP

## 2014-01-30 ENCOUNTER — Ambulatory Visit: Payer: Medicare Other | Admitting: Cardiology

## 2014-02-22 ENCOUNTER — Encounter: Payer: Self-pay | Admitting: Family

## 2014-02-22 ENCOUNTER — Ambulatory Visit (INDEPENDENT_AMBULATORY_CARE_PROVIDER_SITE_OTHER): Payer: Medicare Other | Admitting: Family

## 2014-02-22 VITALS — BP 130/68 | HR 88 | Temp 97.6°F | Ht 64.0 in | Wt 233.4 lb

## 2014-02-22 DIAGNOSIS — I739 Peripheral vascular disease, unspecified: Secondary | ICD-10-CM

## 2014-02-22 DIAGNOSIS — I10 Essential (primary) hypertension: Secondary | ICD-10-CM

## 2014-02-22 DIAGNOSIS — K2100 Gastro-esophageal reflux disease with esophagitis, without bleeding: Secondary | ICD-10-CM

## 2014-02-22 DIAGNOSIS — E785 Hyperlipidemia, unspecified: Secondary | ICD-10-CM

## 2014-02-22 DIAGNOSIS — E559 Vitamin D deficiency, unspecified: Secondary | ICD-10-CM

## 2014-02-22 DIAGNOSIS — E039 Hypothyroidism, unspecified: Secondary | ICD-10-CM

## 2014-02-22 NOTE — Patient Instructions (Signed)

## 2014-02-22 NOTE — Progress Notes (Signed)
Subjective:    Patient ID: Betty Keller, female    DOB: 07/11/1936, 78 y.o.   MRN: [id number]  Hypertension This is a chronic problem. The current episode started more than 1 year ago. The problem has been resolved since onset. The problem is controlled. Associated symptoms include peripheral edema and shortness of breath. Pertinent negatives include no anxiety, headaches or palpitations. Risk factors for coronary artery disease include dyslipidemia, family history, obesity and post-menopausal state. Past treatments include ACE inhibitors and diuretics. The current treatment provides moderate improvement. Hypertensive end-organ damage includes a thyroid problem. There is no history of kidney disease, CAD/MI, CVA or heart failure. There is no history of sleep apnea.  Hyperlipidemia This is a chronic problem. The current episode started more than 1 year ago. The problem is controlled. Recent lipid tests were reviewed and are normal. Exacerbating diseases include hypothyroidism and obesity. She has no history of diabetes. Factors aggravating her hyperlipidemia include fatty foods. Associated symptoms include shortness of breath. Pertinent negatives include no myalgias. Current antihyperlipidemic treatment includes statins. The current treatment provides significant improvement of lipids. Risk factors for coronary artery disease include dyslipidemia, family history, hypertension, obesity and post-menopausal.  Thyroid Problem Presents for follow-up visit. Symptoms include constipation, leg swelling and weight gain. Patient reports no anxiety, depressed mood, diarrhea, hair loss or palpitations. The symptoms have been improving. Past treatments include levothyroxine. The treatment provided moderate relief. Her past medical history is significant for hyperlipidemia. There is no history of diabetes or heart failure.  COPD Pt currently taking symbicort as needed for her breathing (Which pt states is rarely).  Pt does not want a rescue inhaler prescribed. Pt states she does not get out much and her breathing is do "good enough".    Review of Systems  Constitutional: Positive for weight gain.  HENT: Negative.   Eyes: Negative.   Respiratory: Positive for shortness of breath.   Cardiovascular: Negative.  Negative for palpitations.  Gastrointestinal: Positive for constipation. Negative for diarrhea.  Endocrine: Negative.   Genitourinary: Negative.   Musculoskeletal: Negative.  Negative for myalgias.  Neurological: Negative.  Negative for headaches.  Hematological: Negative.   Psychiatric/Behavioral: Negative.   All other systems reviewed and are negative.      Objective:   Physical Exam  Vitals reviewed. Constitutional: She is oriented to person, place, and time. She appears well-developed and well-nourished. No distress.  HENT:  Head: Normocephalic and atraumatic.  Right Ear: External ear normal.  Mouth/Throat: Oropharynx is clear and moist.  Eyes: Pupils are equal, round, and reactive to light.  Neck: Normal range of motion. Neck supple. No thyromegaly present.  Cardiovascular: Normal rate, regular rhythm, normal heart sounds and intact distal pulses.   No murmur heard. Pulmonary/Chest: Effort normal and breath sounds normal. No respiratory distress. She has no wheezes.  Abdominal: Soft. Bowel sounds are normal. She exhibits no distension. There is no tenderness.  Musculoskeletal: Normal range of motion. She exhibits edema (3+edema in bilateral legs and feet). She exhibits no tenderness.  Neurological: She is alert and oriented to person, place, and time. She has normal reflexes. No cranial nerve deficit.  Skin: Skin is warm and dry.  Psychiatric: She has a normal mood and affect. Her behavior is normal. Judgment and thought content normal.    BP 130/68  Pulse 88  Temp(Src) 97.6 F (36.4 C) (Oral)  Ht _0  (1.626 m)  Wt 233 lb 6.4 oz (105.87 kg)  BMI 40.04 kg/m2  Assessment & Plan:  1. Essential hypertension - CMP14+EGFR  2. Gastroesophageal reflux disease with esophagitis  3. Hypothyroidism, unspecified hypothyroidism type - Thyroid Panel With TSH  4. Hyperlipidemia - Lipid panel  5. Unspecified vitamin D deficiency - Vit D  25 hydroxy (rtn osteoporosis monitoring)   6. Peripheral vascular disease, unspecified Pt's son to wrap feet and legs with ace bandages (pt can not tolerate hose) Keep feet elevated above heart while sitting Start taking lasix daily (pt had only taken prn)   Continue all meds Labs pending Health Maintenance reviewed Diet and exercise encouraged RTO 3 month  Evelina Dun, FNP

## 2014-02-23 ENCOUNTER — Ambulatory Visit: Payer: Medicare Other | Admitting: Family

## 2014-02-23 LAB — LIPID PANEL
CHOLESTEROL TOTAL: 174 mg/dL (ref 100–199)
Chol/HDL Ratio: 2.2 ratio units (ref 0.0–4.4)
HDL: 79 mg/dL (ref >39)
LDL CALC: 79 mg/dL (ref 0–99)
Triglycerides: 79 mg/dL (ref 0–149)
VLDL Cholesterol Cal: 16 mg/dL (ref 5–40)

## 2014-02-23 LAB — CMP14+EGFR
ALBUMIN: 4.5 g/dL (ref 3.5–4.8)
ALK PHOS: 95 IU/L (ref 39–117)
ALT: 18 IU/L (ref 0–32)
AST: 16 IU/L (ref 0–40)
Albumin/Globulin Ratio: 1.7 (ref 1.1–2.5)
BUN/Creatinine Ratio: 16 (ref 11–26)
BUN: 16 mg/dL (ref 8–27)
CO2: 25 mmol/L (ref 18–29)
Calcium: 9.7 mg/dL (ref 8.7–10.3)
Chloride: 93 mmol/L — ABNORMAL LOW (ref 97–108)
Creatinine, Ser: 0.99 mg/dL (ref 0.57–1.00)
GFR calc Af Amer: 64 mL/min/{1.73_m2} (ref >59)
GFR calc non Af Amer: 56 mL/min/{1.73_m2} — ABNORMAL LOW (ref >59)
GLUCOSE: 96 mg/dL (ref 65–99)
Globulin, Total: 2.7 g/dL (ref 1.5–4.5)
POTASSIUM: 4.1 mmol/L (ref 3.5–5.2)
Sodium: 140 mmol/L (ref 134–144)
Total Bilirubin: 0.4 mg/dL (ref 0.0–1.2)
Total Protein: 7.2 g/dL (ref 6.0–8.5)

## 2014-02-23 LAB — THYROID PANEL WITH TSH
Free Thyroxine Index: 2.6 (ref 1.2–4.9)
T3 UPTAKE RATIO: 27 % (ref 24–39)
T4, Total: 9.6 ug/dL (ref 4.5–12.0)
TSH: 3.7 u[IU]/mL (ref 0.450–4.500)

## 2014-02-23 LAB — VITAMIN D 25 HYDROXY (VIT D DEFICIENCY, FRACTURES): VIT D 25 HYDROXY: 31.9 ng/mL (ref 30.0–100.0)

## 2014-05-29 ENCOUNTER — Ambulatory Visit: Payer: Medicare Other | Admitting: Family Medicine

## 2014-06-01 ENCOUNTER — Ambulatory Visit (INDEPENDENT_AMBULATORY_CARE_PROVIDER_SITE_OTHER): Payer: Medicare Other | Admitting: Family Medicine

## 2014-06-01 ENCOUNTER — Ambulatory Visit: Payer: Medicare Other | Admitting: Family Medicine

## 2014-06-01 ENCOUNTER — Encounter: Payer: Self-pay | Admitting: Family Medicine

## 2014-06-01 VITALS — BP 125/71 | HR 88 | Temp 98.4°F | Ht 64.0 in | Wt 229.2 lb

## 2014-06-01 DIAGNOSIS — I872 Venous insufficiency (chronic) (peripheral): Secondary | ICD-10-CM

## 2014-06-01 DIAGNOSIS — I10 Essential (primary) hypertension: Secondary | ICD-10-CM

## 2014-06-01 DIAGNOSIS — L03119 Cellulitis of unspecified part of limb: Secondary | ICD-10-CM

## 2014-06-01 DIAGNOSIS — R609 Edema, unspecified: Secondary | ICD-10-CM

## 2014-06-01 MED ORDER — BUDESONIDE-FORMOTEROL FUMARATE 160-4.5 MCG/ACT IN AERO
2.0000 | INHALATION_SPRAY | Freq: Two times a day (BID) | RESPIRATORY_TRACT | Status: DC
Start: 2014-06-01 — End: 2016-12-15

## 2014-06-01 MED ORDER — METOLAZONE 2.5 MG PO TABS: ORAL_TABLET | ORAL | Status: DC

## 2014-06-01 NOTE — Progress Notes (Signed)
Subjective:    Patient ID: Betty Keller, female    DOB: [birth year], 78 y.o.   MRN: [id number]  HPI 78 year old female who is here for a 3 month recheck on problems that include venous insufficiency with edema, hypertension, hypothyroidism, and hyperlipidemia. Review of her labs from 3 months ago show her lipids to be at goal. Her main concern today is dependent edema where fluid is leaking.    Review of Systems  Constitutional: Negative.   HENT: Negative.   Eyes: Negative.   Respiratory: Negative.   Cardiovascular: Positive for leg swelling.  Gastrointestinal: Negative.   Endocrine: Negative.   Genitourinary: Negative.   Musculoskeletal: Positive for myalgias.       Bilateral thumb pain  Skin: Rash: not true rash but healing intertrigo.  Hematological: Negative.   Psychiatric/Behavioral: Negative.        Objective:   Physical Exam  Constitutional: She is oriented to person, place, and time. She appears well-developed and well-nourished.  Eyes: Conjunctivae and EOM are normal.  Neck: Normal range of motion. Neck supple.  Cardiovascular: Normal rate, regular rhythm and normal heart sounds.   Pulmonary/Chest: Effort normal and breath sounds normal.  Abdominal: Soft. Bowel sounds are normal.  Musculoskeletal: Normal range of motion. She exhibits edema.  Legs are 2+ swollen bilaterally and there is fluid on the skin. She usually wears support stockings but did not wear them today for purposes of examination  Neurological: She is alert and oriented to person, place, and time. She has normal reflexes.  Skin: Skin is warm and dry.  Psychiatric: She has a normal mood and affect. Her behavior is normal. Thought content normal.    BP 125/71 mmHg  Pulse 88  Temp(Src) 98.4 F (36.9 C) (Oral)  Ht $R'5\' 4"'jA$  (1.626 m)  Wt 229 lb 3.2 oz (103.964 kg)  BMI 39.32 kg/m2      Assessment & Plan:  1. Edema  - metolazone (ZAROXOLYN) 2.5 MG tablet; Take one po one half hour prior to  taking lasix for 2 - 3 days prn severe swelling  Dispense: 30 tablet; Refill: 2 - CMP14+EGFR  2. Cellulitis of lower extremity, unspecified laterality No cellulitis today but does have edema - metolazone (ZAROXOLYN) 2.5 MG tablet; Take one po one half hour prior to taking lasix for 2 - 3 days prn severe swelling  Dispense: 30 tablet; Refill: 2 - CMP14+EGFR  3. Essential hypertension   4. Chronic venous insufficiency Continued elevation and wear support stockings..  $RemoveBefo'@UMFCLOGO'uRrigMcyGcT$ @  Patient ID: Betty Keller MRN: [id number], DOB: Feb 22, 1936, 78 y.o. Date of Encounter: 06/01/2014, 9:27 AM  Primary Physician: Redge Gainer, MD  Chief Complaint:   HPI: 78 y.o. year old female with history below presents with    Past Medical History  Diagnosis Date  . GERD (gastroesophageal reflux disease)   . Esophagitis   . Hypertension   . IBS (irritable bowel syndrome)   . Osteoarthritis     shoulders   . Paresis     rt. pelvis   . History of obesity     lost 100 lbs  . Hypotonic bladder   . Rectocele     distal ; with outlet obstruction   . Insomnia   . Asthma   . Premature ventricular contraction   . BBB (bundle branch block)     left   . Osteopenia   . Hemidiaphragm paralysis   . Vitamin D deficiency   . Hyperlipidemia   . Thyroid disease  hypothyroidism     Home Meds: Prior to Admission medications   Medication Sig Start Date End Date Taking? Authorizing Provider  budesonide-formoterol (SYMBICORT) 160-4.5 MCG/ACT inhaler Inhale 2 puffs into the lungs 2 (two) times daily. 06/01/14  Yes Wardell Honour, MD  Cholecalciferol (VITAMIN D) 2000 UNITS CAPS Take 4,000 Units by mouth daily. One daily   Yes Historical Provider, MD  fish oil-omega-3 fatty acids 1000 MG capsule Take 2 g by mouth daily.   Yes Historical Provider, MD  furosemide (LASIX) 20 MG tablet Take 1 tablet (20 mg total) by mouth daily. 09/18/13  Yes Vernie Shanks, MD  levothyroxine (SYNTHROID, LEVOTHROID) 50 MCG  tablet Take 1 tablet (50 mcg total) by mouth daily before breakfast. 09/18/13  Yes Vernie Shanks, MD  lisinopril-hydrochlorothiazide (PRINZIDE,ZESTORETIC) 10-12.5 MG per tablet Take 1 tablet by mouth daily. 09/18/13  Yes Vernie Shanks, MD  metolazone (ZAROXOLYN) 2.5 MG tablet Take one po one half hour prior to taking lasix for 2 - 3 days prn severe swelling 06/01/14  Yes Wardell Honour, MD  nortriptyline (PAMELOR) 75 MG capsule Take 1 capsule (75 mg total) by mouth at bedtime. 09/18/13  Yes Vernie Shanks, MD  ranitidine (ZANTAC) 150 MG tablet Take 1 tablet (150 mg total) by mouth 2 (two) times daily. 09/18/13  Yes Vernie Shanks, MD  simvastatin (ZOCOR) 40 MG tablet Take 1 tablet (40 mg total) by mouth at bedtime. 09/18/13  Yes Vernie Shanks, MD  triamcinolone cream (KENALOG) 0.1 %  04/27/14   Historical Provider, MD    Allergies:  Allergies  Allergen Reactions  . Alprazolam   . Buspar [Buspirone Hcl]   . Lorazepam     History   Social History  . Marital Status: Single    Spouse Name: N/A    Number of Children: N/A  . Years of Education: N/A   Occupational History  . Not on file.   Social History Main Topics  . Smoking status: Former Smoker    Start date: 04/05/1972  . Smokeless tobacco: Not on file  . Alcohol Use: No  . Drug Use: No  . Sexual Activity: Not on file   Other Topics Concern  . Not on file   Social History Narrative   Lives with husband.   One child.       Review of Systems: Constitutional: negative for chills, fever, night sweats, weight changes, or fatigue  HEENT: negative for vision changes, hearing loss, congestion, rhinorrhea, ST, epistaxis, or sinus pressure Cardiovascular: negative for chest pain or palpitations Respiratory: negative for hemoptysis, wheezing, shortness of breath, or cough Abdominal: negative for abdominal pain, nausea, vomiting, diarrhea, or constipation Dermatological: negative for rash Neurologic: negative for headache, dizziness, or  syncope All other systems reviewed and are otherwise negative with the exception to those above and in the HPI.   Physical Exam: Blood pressure 125/71, pulse 88, temperature 98.4 F (36.9 C), temperature source Oral, height $RemoveBefo'5\' 4"'mzzljzwiUlm$  (1.626 m), weight 229 lb 3.2 oz (103.964 kg)., Body mass index is 39.32 kg/(m^2). General: Well developed, well nourished, in no acute distress. Head: Normocephalic, atraumatic, eyes without discharge, sclera non-icteric, nares are without discharge. Bilateral auditory canals clear, TM's are without perforation, pearly grey and translucent with reflective cone of light bilaterally. Oral cavity moist, posterior pharynx without exudate, erythema, peritonsillar abscess, or post nasal drip.  Neck: Supple. No thyromegaly. Full ROM. No lymphadenopathy. Lungs: Clear bilaterally to auscultation without wheezes, rales, or rhonchi. Breathing is  unlabored. Heart: RRR with S1 S2. No murmurs, rubs, or gallops appreciated. Abdomen: Soft, non-tender, non-distended with normoactive bowel sounds. No hepatomegaly. No rebound/guarding. No obvious abdominal masses. Msk:  Strength and tone normal for age. Extremities/Skin: Warm and dry. No clubbing or cyanosis. No edema. No rashes or suspicious lesions. Neuro: Alert and oriented X 3. Moves all extremities spontaneously. Gait is normal. CNII-XII grossly in tact. Psych:  Responds to questions appropriately with a normal affect.   Labs:   ASSESSMENT AND PLAN:  78 y.o. year old female with    06/01/2014 9:27 AM   - [id number]

## 2014-06-01 NOTE — Patient Instructions (Signed)

## 2014-06-02 LAB — CMP14+EGFR
A/G RATIO: 1.6 (ref 1.1–2.5)
ALT: 17 IU/L (ref 0–32)
AST: 20 IU/L (ref 0–40)
Albumin: 4.2 g/dL (ref 3.5–4.8)
Alkaline Phosphatase: 92 IU/L (ref 39–117)
BUN/Creatinine Ratio: 16 (ref 11–26)
BUN: 16 mg/dL (ref 8–27)
CALCIUM: 9.7 mg/dL (ref 8.7–10.3)
CO2: 26 mmol/L (ref 18–29)
CREATININE: 0.97 mg/dL (ref 0.57–1.00)
Chloride: 94 mmol/L — ABNORMAL LOW (ref 97–108)
GFR calc non Af Amer: 57 mL/min/{1.73_m2} — ABNORMAL LOW (ref >59)
GFR, EST AFRICAN AMERICAN: 66 mL/min/{1.73_m2} (ref >59)
Globulin, Total: 2.7 g/dL (ref 1.5–4.5)
Glucose: 101 mg/dL — ABNORMAL HIGH (ref 65–99)
Potassium: 4.7 mmol/L (ref 3.5–5.2)
Sodium: 140 mmol/L (ref 134–144)
TOTAL PROTEIN: 6.9 g/dL (ref 6.0–8.5)
Total Bilirubin: 0.3 mg/dL (ref 0.0–1.2)

## 2014-06-06 ENCOUNTER — Other Ambulatory Visit: Payer: Medicare Other

## 2014-06-06 ENCOUNTER — Other Ambulatory Visit (INDEPENDENT_AMBULATORY_CARE_PROVIDER_SITE_OTHER): Payer: Medicare Other

## 2014-06-06 DIAGNOSIS — R609 Edema, unspecified: Secondary | ICD-10-CM

## 2014-06-06 NOTE — Progress Notes (Signed)
Lab only 

## 2014-06-07 LAB — BMP8+EGFR
BUN / CREAT RATIO: 22 (ref 11–26)
BUN: 20 mg/dL (ref 8–27)
CO2: 29 mmol/L (ref 18–29)
CREATININE: 0.93 mg/dL (ref 0.57–1.00)
Calcium: 9.7 mg/dL (ref 8.7–10.3)
Chloride: 94 mmol/L — ABNORMAL LOW (ref 97–108)
GFR calc Af Amer: 69 mL/min/{1.73_m2} (ref >59)
GFR, EST NON AFRICAN AMERICAN: 60 mL/min/{1.73_m2} (ref >59)
Glucose: 115 mg/dL — ABNORMAL HIGH (ref 65–99)
Potassium: 4.3 mmol/L (ref 3.5–5.2)
Sodium: 140 mmol/L (ref 134–144)

## 2014-09-07 ENCOUNTER — Ambulatory Visit (INDEPENDENT_AMBULATORY_CARE_PROVIDER_SITE_OTHER): Payer: Medicare Other | Admitting: Family Medicine

## 2014-09-07 ENCOUNTER — Encounter: Payer: Self-pay | Admitting: Family Medicine

## 2014-09-07 VITALS — BP 159/84 | HR 85 | Temp 98.6°F | Ht 64.0 in | Wt 232.0 lb

## 2014-09-07 DIAGNOSIS — E079 Disorder of thyroid, unspecified: Secondary | ICD-10-CM | POA: Diagnosis not present

## 2014-09-07 DIAGNOSIS — I10 Essential (primary) hypertension: Secondary | ICD-10-CM

## 2014-09-07 DIAGNOSIS — J449 Chronic obstructive pulmonary disease, unspecified: Secondary | ICD-10-CM

## 2014-09-07 DIAGNOSIS — K2100 Gastro-esophageal reflux disease with esophagitis, without bleeding: Secondary | ICD-10-CM

## 2014-09-07 DIAGNOSIS — E785 Hyperlipidemia, unspecified: Secondary | ICD-10-CM

## 2014-09-07 NOTE — Progress Notes (Signed)
   Subjective:    Patient ID: Betty GunnelsBarbara A Keller, female    DOB: 1936/04/21, 79 y.o.   MRN: [id number]  HPI 79 year old female who is here to follow-up hypertension and edema. The edema has improved on the regimen of metolazone and furosemide. She lost her husband in August 2015 but she has lots of family and supportive many friends that have kept her positive and busy. She denies chest pain, shortness of breath.  She generally sleeps well. She does relate some fatigue if she does too much.  Patient Active Problem List   Diagnosis Date Noted  . ERRONEOUS ENCOUNTER--DISREGARD 11/02/2013  . Hypothyroid 09/18/2013  . Bronchitis, chronic obstructive w acute bronchitis 06/29/2013  . COPD (chronic obstructive pulmonary disease) 06/27/2013  . Chest congestion 06/27/2013  . Thyroid disease   . Acute upper respiratory infections of unspecified site 03/28/2013  . Hyperlipidemia   . Unspecified vitamin D deficiency 01/24/2013  . Cerumen impaction 11/15/2012  . Chronic venous insufficiency 11/15/2012  . Pedal edema 11/15/2012  . GERD (gastroesophageal reflux disease) 10/13/2010  . HTN (hypertension) 10/13/2010  . IBS (irritable bowel syndrome) 10/13/2010  . Osteoarthritis of shoulder 10/13/2010  . Obesity 10/13/2010  . Rectocele 10/13/2010  . Insomnia 10/13/2010  . Bundle branch block, left 10/13/2010  . Asthma 10/13/2010   Outpatient Encounter Prescriptions as of 09/07/2014  Medication Sig  . budesonide-formoterol (SYMBICORT) 160-4.5 MCG/ACT inhaler Inhale 2 puffs into the lungs 2 (two) times daily.  . Cholecalciferol (VITAMIN D) 2000 UNITS CAPS Take 4,000 Units by mouth daily. One daily  . fish oil-omega-3 fatty acids 1000 MG capsule Take 2 g by mouth daily.  . furosemide (LASIX) 20 MG tablet Take 1 tablet (20 mg total) by mouth daily.  Marland Kitchen. levothyroxine (SYNTHROID, LEVOTHROID) 50 MCG tablet Take 1 tablet (50 mcg total) by mouth daily before breakfast.  . lisinopril-hydrochlorothiazide  (PRINZIDE,ZESTORETIC) 10-12.5 MG per tablet Take 1 tablet by mouth daily.  . metolazone (ZAROXOLYN) 2.5 MG tablet Take one po one half hour prior to taking lasix for 2 - 3 days prn severe swelling  . nortriptyline (PAMELOR) 75 MG capsule Take 1 capsule (75 mg total) by mouth at bedtime.  . ranitidine (ZANTAC) 150 MG tablet Take 1 tablet (150 mg total) by mouth 2 (two) times daily.  . simvastatin (ZOCOR) 40 MG tablet Take 1 tablet (40 mg total) by mouth at bedtime.  . triamcinolone cream (KENALOG) 0.1 %      Review of Systems  Constitutional: Negative.   HENT: Negative.   Respiratory: Negative.   Cardiovascular: Negative.   Gastrointestinal: Negative.   Genitourinary: Negative.   Neurological: Negative.   Psychiatric/Behavioral: Negative.        Objective:   Physical Exam  Constitutional: She is oriented to person, place, and time. She appears well-developed.  Cardiovascular: Normal rate, regular rhythm and normal heart sounds.   Pulmonary/Chest: Effort normal and breath sounds normal.  Musculoskeletal: She exhibits edema.  Neurological: She is alert and oriented to person, place, and time.    BP 159/84 mmHg  Pulse 85  Temp(Src) 98.6 F (37 C) (Oral)  Ht 5\' 4"  (1.626 m)  Wt 232 lb (105.235 kg)  BMI 39.80 kg/m2       Assessment & Plan:  1. Thyroid disease   2. Hyperlipidemia   3. Essential hypertension   4. Chronic obstructive pulmonary disease, unspecified COPD, unspecified chronic bronchitis type   5. Gastroesophageal reflux disease with esophagitis   Frederica KusterStephen M Miller MD

## 2014-09-12 DIAGNOSIS — Z961 Presence of intraocular lens: Secondary | ICD-10-CM | POA: Diagnosis not present

## 2014-09-12 DIAGNOSIS — H5203 Hypermetropia, bilateral: Secondary | ICD-10-CM | POA: Diagnosis not present

## 2014-09-12 DIAGNOSIS — H04123 Dry eye syndrome of bilateral lacrimal glands: Secondary | ICD-10-CM | POA: Diagnosis not present

## 2014-09-12 DIAGNOSIS — H3531 Nonexudative age-related macular degeneration: Secondary | ICD-10-CM | POA: Diagnosis not present

## 2014-09-13 ENCOUNTER — Other Ambulatory Visit: Payer: Self-pay

## 2014-09-13 ENCOUNTER — Other Ambulatory Visit: Payer: Self-pay | Admitting: Family Medicine

## 2014-09-13 DIAGNOSIS — L03119 Cellulitis of unspecified part of limb: Secondary | ICD-10-CM

## 2014-09-13 DIAGNOSIS — R609 Edema, unspecified: Secondary | ICD-10-CM

## 2014-09-13 DIAGNOSIS — E785 Hyperlipidemia, unspecified: Secondary | ICD-10-CM

## 2014-09-13 MED ORDER — NORTRIPTYLINE HCL 75 MG PO CAPS: 75.0000 mg | ORAL_CAPSULE | Freq: Every day | ORAL | Status: DC

## 2014-09-13 MED ORDER — SIMVASTATIN 40 MG PO TABS: 40.0000 mg | ORAL_TABLET | Freq: Every day | ORAL | Status: DC

## 2014-09-13 NOTE — Telephone Encounter (Signed)
done

## 2014-10-10 ENCOUNTER — Telehealth: Payer: Self-pay | Admitting: Family Medicine

## 2014-10-10 DIAGNOSIS — I8312 Varicose veins of left lower extremity with inflammation: Secondary | ICD-10-CM | POA: Diagnosis not present

## 2014-10-11 NOTE — Telephone Encounter (Signed)
Patient aware.

## 2014-10-11 NOTE — Telephone Encounter (Signed)
Okay to double up on Lasix but I would not do it for more than 3 days anaerobic. May do intermittently, that is week by week

## 2014-10-11 NOTE — Telephone Encounter (Signed)
Patient saw Harriette OharaJan Johnson at [facility] and they suggest that she take 2 lasix a day due to fluid retention in her legs. They wanted her to check with her pcp about this.

## 2014-10-31 ENCOUNTER — Telehealth: Payer: Self-pay | Admitting: Family Medicine

## 2014-10-31 MED ORDER — FUROSEMIDE 20 MG PO TABS: 20.0000 mg | ORAL_TABLET | Freq: Every day | ORAL | Status: DC

## 2014-10-31 NOTE — Telephone Encounter (Signed)
done

## 2014-11-05 ENCOUNTER — Ambulatory Visit (INDEPENDENT_AMBULATORY_CARE_PROVIDER_SITE_OTHER): Payer: Medicare Other | Admitting: Nurse Practitioner

## 2014-11-05 ENCOUNTER — Ambulatory Visit (INDEPENDENT_AMBULATORY_CARE_PROVIDER_SITE_OTHER): Payer: Medicare Other

## 2014-11-05 ENCOUNTER — Encounter: Payer: Self-pay | Admitting: Nurse Practitioner

## 2014-11-05 VITALS — BP 158/75 | HR 90 | Temp 97.2°F | Ht 64.0 in | Wt 234.0 lb

## 2014-11-05 DIAGNOSIS — L03115 Cellulitis of right lower limb: Secondary | ICD-10-CM | POA: Diagnosis not present

## 2014-11-05 DIAGNOSIS — M25561 Pain in right knee: Secondary | ICD-10-CM

## 2014-11-05 DIAGNOSIS — I878 Other specified disorders of veins: Secondary | ICD-10-CM | POA: Diagnosis not present

## 2014-11-05 MED ORDER — CIPROFLOXACIN HCL 500 MG PO TABS: 500.0000 mg | ORAL_TABLET | Freq: Two times a day (BID) | ORAL | Status: DC

## 2014-11-05 MED ORDER — FUROSEMIDE 40 MG PO TABS: 40.0000 mg | ORAL_TABLET | Freq: Every day | ORAL | Status: DC

## 2014-11-05 NOTE — Progress Notes (Signed)
   Subjective:    Patient ID: Betty Keller, female    DOB: 12-Apr-1936, 79 y.o.   MRN: [id number]  HPI Patient in c/o right leg redness and pain. She said she fell 2 weeks ago and it did not start turning red until after fall. Mild edema. Painful to walk on.    Review of Systems  Constitutional: Negative.   HENT: Negative.   Respiratory: Negative.   Cardiovascular: Negative.   Gastrointestinal: Negative.   Genitourinary: Negative.   Neurological: Negative.   Psychiatric/Behavioral: Negative.   All other systems reviewed and are negative.      Objective:   Physical Exam  Constitutional: She appears well-developed and well-nourished.  Cardiovascular: Normal rate, regular rhythm and normal heart sounds.   Pulmonary/Chest: Effort normal and breath sounds normal.  Musculoskeletal:  Right lower extremity edema and erythema- hot to touch  Skin: Skin is warm and dry.  Psychiatric: She has a normal mood and affect. Her behavior is normal. Judgment and thought content normal.   BP 158/75 mmHg  Pulse 90  Temp(Src) 97.2 F (36.2 C) (Oral)  Ht 5\' 4"  (1.626 m)  Wt 234 lb (106.142 kg)  BMI 40.15 kg/m2  Knee x ray- normal-Preliminary reading by Paulene FloorMary Ladarrious Kirksey, FNP  [facility]        Assessment & Plan:   1. Knee pain, acute, right   2. Venous stasis   3. Cellulitis of right lower extremity    Meds ordered this encounter  Medications  . furosemide (LASIX) 40 MG tablet    Sig: Take 1 tablet (40 mg total) by mouth daily.    Dispense:  30 tablet    Refill:  3    Order Specific Question:  Supervising Provider    Answer:  Ernestina PennaMOORE, DONALD W [[id number]]  . ciprofloxacin (CIPRO) 500 MG tablet    Sig: Take 1 tablet (500 mg total) by mouth 2 (two) times daily.    Dispense:  20 tablet    Refill:  0    Order Specific Question:  Supervising Provider    Answer:  Ernestina PennaMOORE, DONALD W [[id number]]   Orders Placed This Encounter  Procedures  . DG Knee 1-2 Views Right    Standing Status: Future     Number of  Occurrences: 1     Standing Expiration Date: 01/05/2016    Order Specific Question:  Reason for Exam (SYMPTOM  OR DIAGNOSIS REQUIRED)    Answer:  knee pain    Order Specific Question:  Preferred imaging location?    Answer:  Internal   Elevate leg when sitting  RTO in 3 days recheck  Betty Daphine DeutscherMartin, FNP

## 2014-11-05 NOTE — Patient Instructions (Signed)

## 2014-11-08 ENCOUNTER — Encounter: Payer: Self-pay | Admitting: Nurse Practitioner

## 2014-11-08 ENCOUNTER — Ambulatory Visit (INDEPENDENT_AMBULATORY_CARE_PROVIDER_SITE_OTHER): Payer: Medicare Other | Admitting: Nurse Practitioner

## 2014-11-08 VITALS — BP 142/84 | HR 83 | Temp 96.8°F | Ht 64.0 in | Wt 229.0 lb

## 2014-11-08 DIAGNOSIS — L03115 Cellulitis of right lower limb: Secondary | ICD-10-CM

## 2014-11-08 DIAGNOSIS — I878 Other specified disorders of veins: Secondary | ICD-10-CM | POA: Diagnosis not present

## 2014-11-08 NOTE — Progress Notes (Signed)
   Subjective:    Patient ID: Betty Keller, female    DOB: 1935-07-25, 79 y.o.   MRN: [id number]  HPI Patient was seen 11/05/14 with cellulitis of right leg. Patient was givne cipro and increased her lasix as well as applied unna boot. Here today for recheck.    Review of Systems  Constitutional: Negative.   HENT: Negative.   Respiratory: Negative.   Cardiovascular: Negative.   Genitourinary: Negative.   Neurological: Negative.   Psychiatric/Behavioral: Negative.   All other systems reviewed and are negative.      Objective:   Physical Exam  Constitutional: She is oriented to person, place, and time. She appears well-developed and well-nourished.  Cardiovascular: Normal rate, regular rhythm and normal heart sounds.   Pulmonary/Chest: Effort normal and breath sounds normal.  Musculoskeletal: She exhibits edema (1+ edema right lower leg and 2+ edema left lower leg).  Neurological: She is alert and oriented to person, place, and time.  Skin: Skin is warm.  Erythema resolving right leg  Psychiatric: She has a normal mood and affect. Her behavior is normal. Judgment and thought content normal.  BP 142/84 mmHg  Pulse 83  Temp(Src) 96.8 F (36 C) (Oral)  Ht 5\' 4"  (1.626 m)  Wt 229 lb (103.874 kg)  BMI 39.29 kg/m2         Assessment & Plan:   1. Cellulitis of leg, right   2. Venous stasis of both lower extremities    Apply bil unna boot Continue antibiotic as rx Continue lasix as rx Elevate legs when sitting Follow up Monday  Mary-Margaret Daphine DeutscherMartin, FNP

## 2014-11-08 NOTE — Patient Instructions (Signed)
Stasis Ulcer ° A stasis ulcer is a sore on the skin. It occurs in the legs when your blood flow is damaged.  °HOME CARE °· Do not stand or sit in one position for a long time. Do not sit with your legs crossed. Raise (elevate) your legs. °· Wear elastic stockings (compression stockings). Do not wear tight clothing around the legs or waist area. Use and apply bandages (dressings) as told. °· Walk as much as possible. If you take long car or plane rides, take a break to walk around every 2 hours. °· Only take medicine as told by your doctor. °· Raise the end of your bed with 2-inch blocks only if your doctor says it is okay. °· If you cut the skin on your leg, lie down and raise your leg. Gently clean the area with a clean cloth. Then, put pressure on the cut until the bleeding stops. Put a bandage on. °· Keep all doctor visits. °GET HELP RIGHT AWAY IF: °· The ulcer area starts to break down. °· You have pain, redness, or tenderness in or around the ulcer. °· You have yellowish-white fluid (pus) or hard puffiness (swelling) in or around the ulcer. °· Your pain gets worse. °· You get a fever. °· You have chest pain or shortness of breath. °MAKE SURE YOU: °· Understand these instructions. °· Will watch your condition. °· Will get help right away if you are not doing well or get worse. °Document Released: 08/13/2004 Document Revised: 10/31/2012 Document Reviewed: 11/04/2010 °Exit[employer]® Patient Information ©2015 [employer]. This information is not intended to replace advice given to you by your health care provider. Make sure you discuss any questions you have with your health care provider. ° °

## 2014-11-12 ENCOUNTER — Ambulatory Visit (INDEPENDENT_AMBULATORY_CARE_PROVIDER_SITE_OTHER): Payer: Medicare Other | Admitting: Nurse Practitioner

## 2014-11-12 ENCOUNTER — Encounter: Payer: Self-pay | Admitting: Nurse Practitioner

## 2014-11-12 VITALS — BP 131/83 | HR 88 | Temp 97.7°F | Ht 64.0 in | Wt 229.0 lb

## 2014-11-12 DIAGNOSIS — I878 Other specified disorders of veins: Secondary | ICD-10-CM | POA: Diagnosis not present

## 2014-11-12 NOTE — Progress Notes (Signed)
   Subjective:    Patient ID: Edyth GunnelsBarbara A Hemberger, female    DOB: 11/01/1935, 79 y.o.   MRN: [id number]  HPI Patient here today for recheck of venous stasis- Unna boots applied bilaterally last week. SHe had cellulitis in right leg and was on cipro - took last dose yesterday. SHe says that her legs feel much better.    Review of Systems  Constitutional: Negative.   HENT: Negative.   Respiratory: Negative.   Cardiovascular: Negative.   Neurological: Negative.   Psychiatric/Behavioral: Negative.   All other systems reviewed and are negative.      Objective:   Physical Exam  Constitutional: She is oriented to person, place, and time. She appears well-developed and well-nourished.  Cardiovascular: Normal rate and regular rhythm.   Pulmonary/Chest: Effort normal and breath sounds normal.  Musculoskeletal: She exhibits edema (mild edema bil lower ext.).  Neurological: She is alert and oriented to person, place, and time.  Skin: Skin is warm.  Psychiatric: She has a normal mood and affect. Her behavior is normal. Judgment and thought content normal.    BP 131/83 mmHg  Pulse 88  Temp(Src) 97.7 F (36.5 C) (Oral)  Ht 5\' 4"  (1.626 m)  Wt 229 lb (103.874 kg)  BMI 39.29 kg/m2       Assessment & Plan:  1. Venous stasis of lower extremity Continue lasix as rx Compression hose bil  Elevate legs when sitting RTO prn  Mary-Margaret Daphine DeutscherMartin, FNP

## 2014-11-12 NOTE — Patient Instructions (Signed)

## 2014-12-10 ENCOUNTER — Ambulatory Visit (INDEPENDENT_AMBULATORY_CARE_PROVIDER_SITE_OTHER): Payer: Medicare Other | Admitting: Family Medicine

## 2014-12-10 ENCOUNTER — Encounter: Payer: Self-pay | Admitting: Family Medicine

## 2014-12-10 VITALS — BP 126/78 | HR 86 | Temp 98.3°F | Ht 64.0 in | Wt 234.0 lb

## 2014-12-10 DIAGNOSIS — E785 Hyperlipidemia, unspecified: Secondary | ICD-10-CM | POA: Diagnosis not present

## 2014-12-10 DIAGNOSIS — I872 Venous insufficiency (chronic) (peripheral): Secondary | ICD-10-CM

## 2014-12-10 DIAGNOSIS — E039 Hypothyroidism, unspecified: Secondary | ICD-10-CM | POA: Diagnosis not present

## 2014-12-10 DIAGNOSIS — I10 Essential (primary) hypertension: Secondary | ICD-10-CM | POA: Diagnosis not present

## 2014-12-10 DIAGNOSIS — R609 Edema, unspecified: Secondary | ICD-10-CM | POA: Diagnosis not present

## 2014-12-10 DIAGNOSIS — L03119 Cellulitis of unspecified part of limb: Secondary | ICD-10-CM

## 2014-12-10 DIAGNOSIS — I878 Other specified disorders of veins: Secondary | ICD-10-CM

## 2014-12-10 DIAGNOSIS — K219 Gastro-esophageal reflux disease without esophagitis: Secondary | ICD-10-CM

## 2014-12-10 MED ORDER — FUROSEMIDE 40 MG PO TABS: 40.0000 mg | ORAL_TABLET | Freq: Every day | ORAL | Status: DC

## 2014-12-10 MED ORDER — SIMVASTATIN 40 MG PO TABS: 40.0000 mg | ORAL_TABLET | Freq: Every day | ORAL | Status: DC

## 2014-12-10 MED ORDER — METOLAZONE 2.5 MG PO TABS: ORAL_TABLET | ORAL | Status: DC

## 2014-12-10 MED ORDER — RANITIDINE HCL 150 MG PO TABS: 150.0000 mg | ORAL_TABLET | Freq: Two times a day (BID) | ORAL | Status: AC

## 2014-12-10 MED ORDER — LEVOTHYROXINE SODIUM 50 MCG PO TABS: 50.0000 ug | ORAL_TABLET | Freq: Every day | ORAL | Status: DC

## 2014-12-10 MED ORDER — LISINOPRIL-HYDROCHLOROTHIAZIDE 10-12.5 MG PO TABS: 1.0000 | ORAL_TABLET | Freq: Every day | ORAL | Status: DC

## 2014-12-10 NOTE — Addendum Note (Signed)
Addended by: Gwenith DailyHUDY, KRISTEN N on: 12/10/2014 01:16 PM   Modules accepted: Orders

## 2014-12-10 NOTE — Progress Notes (Signed)
Subjective:    Patient ID: Betty Keller, female    DOB: 03-05-1936, 79 y.o.   MRN: [id number]  HPI  79 year old female here to follow-up chronic venous insufficiency with edema. There may be some confusion about her diuretic therapy, 20 versus 40 mg. She is supposed to be taking 40 mg of Lasix and 2.5 of metolazone. Edema may be a little better but there are still some venous stasis erythema on the left leg and ankle. She elevates her legs whenever possible and has a friend who HER legs.  Patient Active Problem List   Diagnosis Date Noted  . ERRONEOUS ENCOUNTER--DISREGARD 11/02/2013  . Hypothyroid 09/18/2013  . Bronchitis, chronic obstructive w acute bronchitis 06/29/2013  . COPD (chronic obstructive pulmonary disease) 06/27/2013  . Chest congestion 06/27/2013  . Thyroid disease   . Acute upper respiratory infections of unspecified site 03/28/2013  . Hyperlipidemia   . Unspecified vitamin D deficiency 01/24/2013  . Cerumen impaction 11/15/2012  . Chronic venous insufficiency 11/15/2012  . Pedal edema 11/15/2012  . GERD (gastroesophageal reflux disease) 10/13/2010  . HTN (hypertension) 10/13/2010  . IBS (irritable bowel syndrome) 10/13/2010  . Osteoarthritis of shoulder 10/13/2010  . Obesity 10/13/2010  . Rectocele 10/13/2010  . Insomnia 10/13/2010  . Bundle branch block, left 10/13/2010  . Asthma 10/13/2010   Outpatient Encounter Prescriptions as of 12/10/2014  Medication Sig  . budesonide-formoterol (SYMBICORT) 160-4.5 MCG/ACT inhaler Inhale 2 puffs into the lungs 2 (two) times daily.  . Cholecalciferol (VITAMIN D) 2000 UNITS CAPS Take 4,000 Units by mouth daily. One daily  . fish oil-omega-3 fatty acids 1000 MG capsule Take 2 g by mouth daily.  . furosemide (LASIX) 40 MG tablet Take 1 tablet (40 mg total) by mouth daily.  Marland Kitchen levothyroxine (SYNTHROID, LEVOTHROID) 50 MCG tablet Take 1 tablet (50 mcg total) by mouth daily before breakfast.  . lisinopril-hydrochlorothiazide  (PRINZIDE,ZESTORETIC) 10-12.5 MG per tablet Take 1 tablet by mouth daily.  . metolazone (ZAROXOLYN) 2.5 MG tablet Take one po one half hour prior to taking lasix for 2 - 3 days prn severe swelling  . nortriptyline (PAMELOR) 75 MG capsule Take 1 capsule (75 mg total) by mouth at bedtime.  . ranitidine (ZANTAC) 150 MG tablet Take 1 tablet (150 mg total) by mouth 2 (two) times daily.  . simvastatin (ZOCOR) 40 MG tablet Take 1 tablet (40 mg total) by mouth at bedtime.  . triamcinolone cream (KENALOG) 0.1 %   . [DISCONTINUED] ciprofloxacin (CIPRO) 500 MG tablet Take 1 tablet (500 mg total) by mouth 2 (two) times daily. (Patient not taking: Reported on 11/12/2014)   No facility-administered encounter medications on file as of 12/10/2014.       Review of Systems  Constitutional: Negative.   HENT: Negative.   Respiratory: Negative.   Cardiovascular: Positive for leg swelling.  Gastrointestinal: Negative.   Genitourinary: Negative.   Neurological: Negative.   Psychiatric/Behavioral: Negative.        Objective:   Physical Exam  Constitutional: She appears well-developed and well-nourished.  Cardiovascular: Normal rate and regular rhythm.   Pulmonary/Chest: Effort normal.  Musculoskeletal: She exhibits edema.  Neurological: She is alert.  Psychiatric: She has a normal mood and affect.     BP 126/78 mmHg  Pulse 86  Temp(Src) 98.3 F (36.8 C) (Oral)  Ht $R'5\' 4"'rr$  (1.626 m)  Wt 234 lb (106.142 kg)  BMI 40.15 kg/m2      Assessment & Plan:  1. Gastroesophageal reflux disease, esophagitis presence not specified  Symptoms are controlled with Zantac - ranitidine (ZANTAC) 150 MG tablet; Take 1 tablet (150 mg total) by mouth 2 (two) times daily.  Dispense: 180 tablet; Refill: 1  2. Hyperlipidemia Lipids are well managed on Zocor 40 continue same - simvastatin (ZOCOR) 40 MG tablet; Take 1 tablet (40 mg total) by mouth at bedtime.  Dispense: 90 tablet; Refill: 1  3. Essential  hypertension Blood pressure 126/78 well-controlled with this medicine - lisinopril-hydrochlorothiazide (PRINZIDE,ZESTORETIC) 10-12.5 MG per tablet; Take 1 tablet by mouth daily.  Dispense: 90 tablet; Refill: 1  4. Hypothyroidism, unspecified hypothyroidism type Last TSH was therapeutic continue same dose - levothyroxine (SYNTHROID, LEVOTHROID) 50 MCG tablet; Take 1 tablet (50 mcg total) by mouth daily before breakfast.  Dispense: 90 tablet; Refill: 1  5. Venous stasis Continue elevation and wrapping - furosemide (LASIX) 40 MG tablet; Take 1 tablet (40 mg total) by mouth daily.  Dispense: 90 tablet; Refill: 1  6. Chronic venous insufficiency Same as above - BMP8+EGFR  7. Edema Add metolazone to Lasix - metolazone (ZAROXOLYN) 2.5 MG tablet; Take one po one half hour prior to taking lasix for 2 - 3 days prn severe swelling  Dispense: 30 tablet; Refill: 2  8. Cellulitis of lower extremity, unspecified laterality There is no cellulitis but there is stasis dermatitis and she uses steroid cream - metolazone (ZAROXOLYN) 2.5 MG tablet; Take one po one half hour prior to taking lasix for 2 - 3 days prn severe swelling  Dispense: 30 tablet; Refill: 2   Wardell Honour MD

## 2014-12-11 LAB — BMP8+EGFR
BUN/Creatinine Ratio: 15 (ref 11–26)
BUN: 12 mg/dL (ref 8–27)
CO2: 28 mmol/L (ref 18–29)
CREATININE: 0.79 mg/dL (ref 0.57–1.00)
Calcium: 9 mg/dL (ref 8.7–10.3)
Chloride: 97 mmol/L (ref 97–108)
GFR calc non Af Amer: 73 mL/min/{1.73_m2} (ref >59)
GFR, EST AFRICAN AMERICAN: 84 mL/min/{1.73_m2} (ref >59)
Glucose: 86 mg/dL (ref 65–99)
Potassium: 3.9 mmol/L (ref 3.5–5.2)
Sodium: 139 mmol/L (ref 134–144)

## 2015-01-16 DIAGNOSIS — I8312 Varicose veins of left lower extremity with inflammation: Secondary | ICD-10-CM | POA: Diagnosis not present

## 2015-02-18 DIAGNOSIS — I8312 Varicose veins of left lower extremity with inflammation: Secondary | ICD-10-CM | POA: Diagnosis not present

## 2015-02-18 DIAGNOSIS — L84 Corns and callosities: Secondary | ICD-10-CM | POA: Diagnosis not present

## 2015-03-04 ENCOUNTER — Telehealth: Payer: Self-pay | Admitting: Family Medicine

## 2015-03-04 NOTE — Telephone Encounter (Signed)
Patient stated she had swelling and blisters on her legs and hit her leg with a car door and is now leaking fluid.  Patient states she would like to see a doctor but couldn't get in until tomorrow.  Appointment with Dr. Hyacinth Meeker tomorrow at 2

## 2015-03-05 ENCOUNTER — Ambulatory Visit: Payer: Medicare Other | Admitting: Family Medicine

## 2015-03-05 ENCOUNTER — Ambulatory Visit (INDEPENDENT_AMBULATORY_CARE_PROVIDER_SITE_OTHER): Payer: Medicare Other | Admitting: Family Medicine

## 2015-03-05 ENCOUNTER — Encounter: Payer: Self-pay | Admitting: Family Medicine

## 2015-03-05 VITALS — BP 139/83 | HR 71 | Temp 98.6°F

## 2015-03-05 DIAGNOSIS — R609 Edema, unspecified: Secondary | ICD-10-CM | POA: Diagnosis not present

## 2015-03-05 DIAGNOSIS — I10 Essential (primary) hypertension: Secondary | ICD-10-CM | POA: Diagnosis not present

## 2015-03-05 DIAGNOSIS — I872 Venous insufficiency (chronic) (peripheral): Secondary | ICD-10-CM | POA: Diagnosis not present

## 2015-03-05 DIAGNOSIS — R600 Localized edema: Secondary | ICD-10-CM | POA: Diagnosis not present

## 2015-03-05 DIAGNOSIS — L03119 Cellulitis of unspecified part of limb: Secondary | ICD-10-CM

## 2015-03-05 MED ORDER — METOLAZONE 2.5 MG PO TABS: ORAL_TABLET | ORAL | Status: DC

## 2015-03-05 MED ORDER — NORTRIPTYLINE HCL 75 MG PO CAPS: 75.0000 mg | ORAL_CAPSULE | Freq: Every day | ORAL | Status: DC

## 2015-03-05 NOTE — Progress Notes (Signed)
Subjective:    Patient ID: Betty Keller, female    DOB: [birth year], 79 y.o.   MRN: [id number]  HPI 79 year old female with worsening dependent edema. She struck her lower left leg on the corner of a table several days ago and suffered a skin tear as well. She takes Lasix 40 mg a day for the edema which is related to venous stasis disease primarily.  Patient Active Problem List   Diagnosis Date Noted  . ERRONEOUS ENCOUNTER--DISREGARD 11/02/2013  . Hypothyroid 09/18/2013  . Bronchitis, chronic obstructive w acute bronchitis 06/29/2013  . COPD (chronic obstructive pulmonary disease) 06/27/2013  . Chest congestion 06/27/2013  . Thyroid disease   . Acute upper respiratory infections of unspecified site 03/28/2013  . Hyperlipidemia   . Unspecified vitamin D deficiency 01/24/2013  . Cerumen impaction 11/15/2012  . Chronic venous insufficiency 11/15/2012  . Pedal edema 11/15/2012  . GERD (gastroesophageal reflux disease) 10/13/2010  . HTN (hypertension) 10/13/2010  . IBS (irritable bowel syndrome) 10/13/2010  . Osteoarthritis of shoulder 10/13/2010  . Obesity 10/13/2010  . Rectocele 10/13/2010  . Insomnia 10/13/2010  . Bundle branch block, left 10/13/2010  . Asthma 10/13/2010   Outpatient Encounter Prescriptions as of 03/05/2015  Medication Sig  . mupirocin ointment (BACTROBAN) 2 % Place 1 application into the nose 3 (three) times daily.  . budesonide-formoterol (SYMBICORT) 160-4.5 MCG/ACT inhaler Inhale 2 puffs into the lungs 2 (two) times daily.  . Cholecalciferol (VITAMIN D) 2000 UNITS CAPS Take 4,000 Units by mouth daily. One daily  . fish oil-omega-3 fatty acids 1000 MG capsule Take 2 g by mouth daily.  . furosemide (LASIX) 40 MG tablet Take 1 tablet (40 mg total) by mouth daily.  Marland Kitchen levothyroxine (SYNTHROID, LEVOTHROID) 50 MCG tablet Take 1 tablet (50 mcg total) by mouth daily before breakfast.  . lisinopril-hydrochlorothiazide (PRINZIDE,ZESTORETIC) 10-12.5 MG per tablet Take 1  tablet by mouth daily.  . metolazone (ZAROXOLYN) 2.5 MG tablet Take one po one half hour prior to taking lasix for 2 - 3 days prn severe swelling  . nortriptyline (PAMELOR) 75 MG capsule Take 1 capsule (75 mg total) by mouth at bedtime.  . ranitidine (ZANTAC) 150 MG tablet Take 1 tablet (150 mg total) by mouth 2 (two) times daily.  . simvastatin (ZOCOR) 40 MG tablet Take 1 tablet (40 mg total) by mouth at bedtime.  . triamcinolone cream (KENALOG) 0.1 %    No facility-administered encounter medications on file as of 03/05/2015.      Review of Systems  Constitutional: Negative.   Respiratory: Negative.   Cardiovascular: Positive for leg swelling.  Genitourinary: Negative.   Neurological: Negative.        Objective:   Physical Exam  Constitutional: She appears well-developed and well-nourished.  Cardiovascular: Normal rate.   Pulmonary/Chest: Effort normal and breath sounds normal.  Musculoskeletal:  3+ edema lower legs bilaterally. There is stasis dermatitis with bilateral erythema there is a small area on her left upper leg with skin tear. There is no evidence of cellulitis or infection. This area was to and dressed with mupirocin Telfa and simple bandage.   BP 139/83 mmHg  Pulse 71  Temp(Src) 98.6 F (37 C) (Oral)  Wt         Assessment & Plan:  1. Edema To continue with Lasix as before but will add metolazone. She has apparently been on this in the past - metolazone (ZAROXOLYN) 2.5 MG tablet; Take one tablet on Monday, Wednesday and Friday  Dispense: 15  tablet; Refill: 0  2. Cellulitis of lower extremity, unspecified laterality She does not really have cellulitis but merely skin tear and I think conservative treatment is indicated - metolazone (ZAROXOLYN) 2.5 MG tablet; Take one tablet on Monday, Wednesday and Friday  Dispense: 15 tablet; Refill: 0  3. Pedal edema Continue diuretic therapy, salt restriction, elevation, and new prescription for compression  stockings  4. Essential hypertension Blood pressure is good. Would not change medicine  5. Chronic venous insufficiency Treatment for venous insufficiency same as that for edema. See above  Frederica Kuster MD

## 2015-03-07 ENCOUNTER — Telehealth: Payer: Self-pay | Admitting: Family Medicine

## 2015-03-07 MED ORDER — MUPIROCIN 2 % EX OINT: 1.0000 "application " | TOPICAL_OINTMENT | Freq: Three times a day (TID) | CUTANEOUS | Status: DC

## 2015-03-07 MED ORDER — TRIAMCINOLONE ACETONIDE 0.1 % EX CREA: TOPICAL_CREAM | Freq: Three times a day (TID) | CUTANEOUS | Status: DC

## 2015-03-07 NOTE — Telephone Encounter (Signed)
Silvadene is typically used for burns. Refilled triamcinolone and mupirocin at [facility]. Patient aware.

## 2015-03-13 ENCOUNTER — Ambulatory Visit: Payer: Medicare Other | Admitting: Family Medicine

## 2015-03-19 ENCOUNTER — Ambulatory Visit: Payer: Medicare Other | Admitting: Family Medicine

## 2015-04-02 DIAGNOSIS — H3531 Nonexudative age-related macular degeneration: Secondary | ICD-10-CM | POA: Diagnosis not present

## 2015-04-02 DIAGNOSIS — H35363 Drusen (degenerative) of macula, bilateral: Secondary | ICD-10-CM | POA: Diagnosis not present

## 2015-04-02 DIAGNOSIS — H04123 Dry eye syndrome of bilateral lacrimal glands: Secondary | ICD-10-CM | POA: Diagnosis not present

## 2015-04-02 DIAGNOSIS — Z961 Presence of intraocular lens: Secondary | ICD-10-CM | POA: Diagnosis not present

## 2015-05-08 ENCOUNTER — Ambulatory Visit (INDEPENDENT_AMBULATORY_CARE_PROVIDER_SITE_OTHER): Payer: Medicare Other | Admitting: Family Medicine

## 2015-05-08 ENCOUNTER — Encounter: Payer: Self-pay | Admitting: Family Medicine

## 2015-05-08 VITALS — BP 158/93 | HR 82 | Temp 98.1°F | Ht 64.0 in | Wt 223.0 lb

## 2015-05-08 DIAGNOSIS — R600 Localized edema: Secondary | ICD-10-CM

## 2015-05-08 MED ORDER — CEPHALEXIN 500 MG PO CAPS: 500.0000 mg | ORAL_CAPSULE | Freq: Two times a day (BID) | ORAL | Status: DC

## 2015-05-08 NOTE — Progress Notes (Signed)
Subjective:    Patient ID: Betty Keller, female    DOB: 1935/08/07, 79 y.o.   MRN: [id number]  HPI 79 year old lady with COPD and dependent edema. Unsure etiology probably some contribution from COPD and right-sided failure. Currently she takes furosemide 40 mg daily and metolazone 2.5 mg on Monday Wednesday and Friday. Since her last visit here 2 months ago she has lost 11 pounds. There has been some improvement in her dependent edema but it still persists.    Review of Systems  Constitutional: Negative.   Respiratory: Negative.   Cardiovascular: Positive for leg swelling.  Genitourinary: Negative.   Musculoskeletal: Negative.   Psychiatric/Behavioral: Negative.    Patient Active Problem List   Diagnosis Date Noted  . ERRONEOUS ENCOUNTER--DISREGARD 11/02/2013  . Hypothyroid 09/18/2013  . Bronchitis, chronic obstructive w acute bronchitis ([place]) 06/29/2013  . COPD (chronic obstructive pulmonary disease) ([place]) 06/27/2013  . Chest congestion 06/27/2013  . Thyroid disease   . Acute upper respiratory infections of unspecified site 03/28/2013  . Hyperlipidemia   . Unspecified vitamin D deficiency 01/24/2013  . Cerumen impaction 11/15/2012  . Chronic venous insufficiency 11/15/2012  . Pedal edema 11/15/2012  . GERD (gastroesophageal reflux disease) 10/13/2010  . HTN (hypertension) 10/13/2010  . IBS (irritable bowel syndrome) 10/13/2010  . Osteoarthritis of shoulder 10/13/2010  . Obesity 10/13/2010  . Rectocele 10/13/2010  . Insomnia 10/13/2010  . Bundle branch block, left 10/13/2010  . Asthma 10/13/2010   Outpatient Encounter Prescriptions as of 05/08/2015  Medication Sig  . budesonide-formoterol (SYMBICORT) 160-4.5 MCG/ACT inhaler Inhale 2 puffs into the lungs 2 (two) times daily.  . Cholecalciferol (VITAMIN D) 2000 UNITS CAPS Take 4,000 Units by mouth daily. One daily  . fish oil-omega-3 fatty acids 1000 MG capsule Take 2 g by mouth daily.  . furosemide (LASIX) 40 MG  tablet Take 1 tablet (40 mg total) by mouth daily.  Marland Kitchen levothyroxine (SYNTHROID, LEVOTHROID) 50 MCG tablet Take 1 tablet (50 mcg total) by mouth daily before breakfast.  . lisinopril-hydrochlorothiazide (PRINZIDE,ZESTORETIC) 10-12.5 MG per tablet Take 1 tablet by mouth daily.  . metolazone (ZAROXOLYN) 2.5 MG tablet Take one tablet on Monday, Wednesday and Friday  . mupirocin ointment (BACTROBAN) 2 % Apply 1 application topically 3 (three) times daily.  . nortriptyline (PAMELOR) 75 MG capsule Take 1 capsule (75 mg total) by mouth at bedtime.  . ranitidine (ZANTAC) 150 MG tablet Take 1 tablet (150 mg total) by mouth 2 (two) times daily.  . simvastatin (ZOCOR) 40 MG tablet Take 1 tablet (40 mg total) by mouth at bedtime.  . triamcinolone cream (KENALOG) 0.1 % Apply topically 3 (three) times daily.  . cephALEXin (KEFLEX) 500 MG capsule Take 1 capsule (500 mg total) by mouth 2 (two) times daily.   No facility-administered encounter medications on file as of 05/08/2015.       Objective:   Physical Exam  Constitutional: She is oriented to person, place, and time. She appears well-developed and well-nourished.  Cardiovascular: Normal rate.   Pulmonary/Chest: Effort normal and breath sounds normal.  Musculoskeletal: She exhibits edema.  Legs look a little less edematous but still 3-4+ as noted before she has lost 11 pounds on diuretics  Neurological: She is alert and oriented to person, place, and time.          Assessment & Plan:  1. Pedal edema Improved but ongoing problem plan continue with Ted stockings walking is okay but avoid prolonged standing elevate feet whenever possible. I think there is  still some stasis dermatitis but patient thinks the redness may be cellulitis really doubted but will give her a short course of Keflex to see if there is any improvement.  Wardell Honour MD - [facility]

## 2015-05-09 LAB — BMP8+EGFR
BUN/Creatinine Ratio: 16 (ref 11–26)
BUN: 12 mg/dL (ref 8–27)
CHLORIDE: 99 mmol/L (ref 97–106)
CO2: 29 mmol/L (ref 18–29)
Calcium: 8.9 mg/dL (ref 8.7–10.3)
Creatinine, Ser: 0.77 mg/dL (ref 0.57–1.00)
GFR calc non Af Amer: 75 mL/min/{1.73_m2} (ref >59)
GFR, EST AFRICAN AMERICAN: 86 mL/min/{1.73_m2} (ref >59)
Glucose: 89 mg/dL (ref 65–99)
POTASSIUM: 4.2 mmol/L (ref 3.5–5.2)
Sodium: 141 mmol/L (ref 136–144)

## 2015-05-20 DIAGNOSIS — Z1231 Encounter for screening mammogram for malignant neoplasm of breast: Secondary | ICD-10-CM | POA: Diagnosis not present

## 2015-05-20 LAB — HM MAMMOGRAPHY: HM Mammogram: NEGATIVE

## 2015-05-22 ENCOUNTER — Encounter: Payer: Self-pay | Admitting: *Deleted

## 2015-06-21 ENCOUNTER — Ambulatory Visit (INDEPENDENT_AMBULATORY_CARE_PROVIDER_SITE_OTHER): Payer: Medicare Other | Admitting: Pharmacist

## 2015-06-21 ENCOUNTER — Encounter: Payer: Self-pay | Admitting: Pharmacist

## 2015-06-21 VITALS — BP 126/72 | HR 68 | Ht 63.5 in | Wt 217.0 lb

## 2015-06-21 DIAGNOSIS — Z0000 Encounter for general adult medical examination without abnormal findings: Secondary | ICD-10-CM | POA: Diagnosis not present

## 2015-06-21 DIAGNOSIS — Z1211 Encounter for screening for malignant neoplasm of colon: Secondary | ICD-10-CM

## 2015-06-21 NOTE — Patient Instructions (Addendum)
Betty Keller , Thank you for taking time to come for your Medicare Wellness Visit. I appreciate your ongoing commitment to your health goals. Please review the following plan we discussed and let me know if I can assist you in the future.   Recommendations: Check cholesterol and thyroid panel at appt with Dr Sabra Heck 07/10/2015 Have Bone Density Checked at appointment 07/10/2015 Start Chair exercises - can help to build muscles and prevent falls Continue with diet - great job with weight loss! Return stool test when come for apptointment 07/10/2015   This is a list of the screening recommended for you and due dates:  Health Maintenance  Topic Date Due  . Shingles Vaccine  01/31/1998  . DEXA scan (bone density measurement)  02/01/2003  . Pneumonia vaccines (1 of 2 - PCV13) 02/01/2003  . Tetanus Vaccine  11/17/2012  . Flu Shot  02/18/2015   Health Maintenance, Female Adopting a healthy lifestyle and getting preventive care can go a long way to promote health and wellness. Talk with your health care provider about what schedule of regular examinations is right for you. This is a good chance for you to check in with your provider about disease prevention and staying healthy. In between checkups, there are plenty of things you can do on your own. Experts have done a lot of research about which lifestyle changes and preventive measures are most likely to keep you healthy. Ask your health care provider for more information. WEIGHT AND DIET  Eat a healthy diet  Be sure to include plenty of vegetables, fruits, low-fat dairy products, and lean protein.  Do not eat a lot of foods high in solid fats, added sugars, or salt.  Get regular exercise. This is one of the most important things you can do for your health.  Most adults should exercise for at least 150 minutes each week. The exercise should increase your heart rate and make you sweat (moderate-intensity exercise).  Most adults should also do  strengthening exercises at least twice a week. This is in addition to the moderate-intensity exercise.  Maintain a healthy weight  Body mass index (BMI) is a measurement that can be used to identify possible weight problems. It estimates body fat based on height and weight. Your health care provider can help determine your BMI and help you achieve or maintain a healthy weight.  For females 40 years of age and older:   A BMI below 18.5 is considered underweight.  A BMI of 18.5 to 24.9 is normal.  A BMI of 25 to 29.9 is considered overweight.  A BMI of 30 and above is considered obese.  Watch levels of cholesterol and blood lipids  You should start having your blood tested for lipids and cholesterol at 77 years of age, then have this test every 5 years.  You may need to have your cholesterol levels checked more often if:  Your lipid or cholesterol levels are high.  You are older than 77 years of age.  You are at high risk for heart disease.  CANCER SCREENING   Lung Cancer  Lung cancer screening is recommended for adults 82-33 years old who are at high risk for lung cancer because of a history of smoking.  A yearly low-dose CT scan of the lungs is recommended for people who:  Currently smoke.  Have quit within the past 15 years.  Have at least a 30-pack-year history of smoking. A pack year is smoking an average of one pack  of cigarettes a day for 1 year.  Yearly screening should continue until it has been 15 years since you quit.  Yearly screening should stop if you develop a health problem that would prevent you from having lung cancer treatment.  Breast Cancer  Practice breast self-awareness. This means understanding how your breasts normally appear and feel.  It also means doing regular breast self-exams. Let your health care provider know about any changes, no matter how small.  If you are in your 20s or 30s, you should have a clinical breast exam (CBE) by a  health care provider every 1-3 years as part of a regular health exam.  If you are 34 or older, have a CBE every year. Also consider having a breast X-ray (mammogram) every year.  If you have a family history of breast cancer, talk to your health care provider about genetic screening.  If you are at high risk for breast cancer, talk to your health care provider about having an MRI and a mammogram every year.  Breast cancer gene (BRCA) assessment is recommended for women who have family members with BRCA-related cancers. BRCA-related cancers include:  Breast.  Ovarian.  Tubal.  Peritoneal cancers.  Results of the assessment will determine the need for genetic counseling and BRCA1 and BRCA2 testing. Cervical Cancer Your health care provider may recommend that you be screened regularly for cancer of the pelvic organs (ovaries, uterus, and vagina). This screening involves a pelvic examination, including checking for microscopic changes to the surface of your cervix (Pap test). You may be encouraged to have this screening done every 3 years, beginning at age 37.  For women ages 46-65, health care providers may recommend pelvic exams and Pap testing every 3 years, or they may recommend the Pap and pelvic exam, combined with testing for human papilloma virus (HPV), every 5 years. Some types of HPV increase your risk of cervical cancer. Testing for HPV may also be done on women of any age with unclear Pap test results.  Other health care providers may not recommend any screening for nonpregnant women who are considered low risk for pelvic cancer and who do not have symptoms. Ask your health care provider if a screening pelvic exam is right for you.  If you have had past treatment for cervical cancer or a condition that could lead to cancer, you need Pap tests and screening for cancer for at least 20 years after your treatment. If Pap tests have been discontinued, your risk factors (such as having a  new sexual partner) need to be reassessed to determine if screening should resume. Some women have medical problems that increase the chance of getting cervical cancer. In these cases, your health care provider may recommend more frequent screening and Pap tests. Colorectal Cancer  This type of cancer can be detected and often prevented.  Routine colorectal cancer screening usually begins at 77 years of age and continues through 77 years of age.  Your health care provider may recommend screening at an earlier age if you have risk factors for colon cancer.  Your health care provider may also recommend using home test kits to check for hidden blood in the stool.  A small camera at the end of a tube can be used to examine your colon directly (sigmoidoscopy or colonoscopy). This is done to check for the earliest forms of colorectal cancer.  Routine screening usually begins at age 56.  Direct examination of the colon should be repeated every 5-10  years through 77 years of age. However, you may need to be screened more often if early forms of precancerous polyps or small growths are found. Skin Cancer  Check your skin from head to toe regularly.  Tell your health care provider about any new moles or changes in moles, especially if there is a change in a mole's shape or color.  Also tell your health care provider if you have a mole that is larger than the size of a pencil eraser.  Always use sunscreen. Apply sunscreen liberally and repeatedly throughout the day.  Protect yourself by wearing long sleeves, pants, a wide-brimmed hat, and sunglasses whenever you are outside. HEART DISEASE, DIABETES, AND HIGH BLOOD PRESSURE   High blood pressure causes heart disease and increases the risk of stroke. High blood pressure is more likely to develop in:  People who have blood pressure in the high end of the normal range (130-139/85-89 mm Hg).  People who are overweight or obese.  People who are  African American.  If you are 68-26 years of age, have your blood pressure checked every 3-5 years. If you are 107 years of age or older, have your blood pressure checked every year. You should have your blood pressure measured twice--once when you are at a hospital or clinic, and once when you are not at a hospital or clinic. Record the average of the two measurements. To check your blood pressure when you are not at a hospital or clinic, you can use:  An automated blood pressure machine at a pharmacy.  A home blood pressure monitor.  If you are between 110 years and 78 years old, ask your health care provider if you should take aspirin to prevent strokes.  Have regular diabetes screenings. This involves taking a blood sample to check your fasting blood sugar level.  If you are at a normal weight and have a low risk for diabetes, have this test once every three years after 77 years of age.  If you are overweight and have a high risk for diabetes, consider being tested at a younger age or more often. PREVENTING INFECTION  Hepatitis B  If you have a higher risk for hepatitis B, you should be screened for this virus. You are considered at high risk for hepatitis B if:  You were born in a country where hepatitis B is common. Ask your health care provider which countries are considered high risk.  Your parents were born in a high-risk country, and you have not been immunized against hepatitis B (hepatitis B vaccine).  You have HIV or AIDS.  You use needles to inject street drugs.  You live with someone who has hepatitis B.  You have had sex with someone who has hepatitis B.  You get hemodialysis treatment.  You take certain medicines for conditions, including cancer, organ transplantation, and autoimmune conditions. Hepatitis C  Blood testing is recommended for:  Everyone born from [birth year] through 1963.  Anyone with known risk factors for hepatitis C. Sexually transmitted infections  (STIs)  You should be screened for sexually transmitted infections (STIs) including gonorrhea and chlamydia if:  You are sexually active and are younger than 77 years of age.  You are older than 77 years of age and your health care provider tells you that you are at risk for this type of infection.  Your sexual activity has changed since you were last screened and you are at an increased risk for chlamydia or gonorrhea. Ask your  health care provider if you are at risk.  If you do not have HIV, but are at risk, it may be recommended that you take a prescription medicine daily to prevent HIV infection. This is called pre-exposure prophylaxis (PrEP). You are considered at risk if:  You are sexually active and do not regularly use condoms or know the HIV status of your partner(s).  You take drugs by injection.  You are sexually active with a partner who has HIV. Talk with your health care provider about whether you are at high risk of being infected with HIV. If you choose to begin PrEP, you should first be tested for HIV. You should then be tested every 3 months for as long as you are taking PrEP.  PREGNANCY   If you are premenopausal and you may become pregnant, ask your health care provider about preconception counseling.  If you may become pregnant, take 400 to 800 micrograms (mcg) of folic acid every day.  If you want to prevent pregnancy, talk to your health care provider about birth control (contraception). OSTEOPOROSIS AND MENOPAUSE   Osteoporosis is a disease in which the bones lose minerals and strength with aging. This can result in serious bone fractures. Your risk for osteoporosis can be identified using a bone density scan.  If you are 73 years of age or older, or if you are at risk for osteoporosis and fractures, ask your health care provider if you should be screened.  Ask your health care provider whether you should take a calcium or vitamin D supplement to lower your risk  for osteoporosis.  Menopause may have certain physical symptoms and risks.  Hormone replacement therapy may reduce some of these symptoms and risks. Talk to your health care provider about whether hormone replacement therapy is right for you.  HOME CARE INSTRUCTIONS   Schedule regular health, dental, and eye exams.  Stay current with your immunizations.   Do not use any tobacco products including cigarettes, chewing tobacco, or electronic cigarettes.  If you are pregnant, do not drink alcohol.  If you are breastfeeding, limit how much and how often you drink alcohol.  Limit alcohol intake to no more than 1 drink per day for nonpregnant women. One drink equals 12 ounces of beer, 5 ounces of wine, or 1 ounces of hard liquor.  Do not use street drugs.  Do not share needles.  Ask your health care provider for help if you need support or information about quitting drugs.  Tell your health care provider if you often feel depressed.  Tell your health care provider if you have ever been abused or do not feel safe at home.   This information is not intended to replace advice given to you by your health care provider. Make sure you discuss any questions you have with your health care provider.   Document Released: 01/19/2011 Document Revised: 07/27/2014 Document Reviewed: 06/07/2013 Elsevier Interactive Patient Education 2016 [place] Prevention in the Home  Falls can cause injuries and can affect people from all age groups. There are many simple things that you can do to make your home safe and to help prevent falls. WHAT CAN I DO ON THE OUTSIDE OF MY HOME?  Regularly repair the edges of walkways and driveways and fix any cracks.  Remove high doorway thresholds.  Trim any shrubbery on the main path into your home.  Use bright outdoor lighting.  Clear walkways of debris and clutter, including tools and rocks.  Regularly check that handrails are securely fastened  and in good repair. Both sides of any steps should have handrails.  Install guardrails along the edges of any raised decks or porches.  Have leaves, snow, and ice cleared regularly.  Use sand or salt on walkways during winter months.  In the garage, clean up any spills right away, including grease or oil spills. WHAT CAN I DO IN THE BATHROOM?  Use night lights.  Install grab bars by the toilet and in the tub and shower. Do not use towel bars as grab bars.  Use non-skid mats or decals on the floor of the tub or shower.  If you need to sit down while you are in the shower, use a plastic, non-slip stool.[employer]  Keep the floor dry. Immediately clean up any water that spills on the floor.  Remove soap buildup in the tub or shower on a regular basis.  Attach bath mats securely with double-sided non-slip rug tape.  Remove throw rugs and other tripping hazards from the floor. WHAT CAN I DO IN THE BEDROOM?  Use night lights.  Make sure that a bedside light is easy to reach.  Do not use oversized bedding that drapes onto the floor.  Have a firm chair that has side arms to use for getting dressed.  Remove throw rugs and other tripping hazards from the floor. WHAT CAN I DO IN THE KITCHEN?   Clean up any spills right away.  Avoid walking on wet floors.  Place frequently used items in easy-to-reach places.  If you need to reach for something above you, use a sturdy step stool that has a grab bar.  Keep electrical cables out of the way.  Do not use floor polish or wax that makes floors slippery. If you have to use wax, make sure that it is non-skid floor wax.  Remove throw rugs and other tripping hazards from the floor. WHAT CAN I DO IN THE STAIRWAYS?  Do not leave any items on the stairs.  Make sure that there are handrails on both sides of the stairs. Fix handrails that are broken or loose. Make sure that handrails are as long as the stairways.  Check any carpeting to make  sure that it is firmly attached to the stairs. Fix any carpet that is loose or worn.  Avoid having throw rugs at the top or bottom of stairways, or secure the rugs with carpet tape to prevent them from moving.  Make sure that you have a light switch at the top of the stairs and the bottom of the stairs. If you do not have them, have them installed. WHAT ARE SOME OTHER FALL PREVENTION TIPS?  Wear closed-toe shoes that fit well and support your feet. Wear shoes that have rubber soles or low heels.  When you use a stepladder, make sure that it is completely opened and that the sides are firmly locked. Have someone hold the ladder while you are using it. Do not climb a closed stepladder.  Add color or contrast paint or tape to grab bars and handrails in your home. Place contrasting color strips on the first and last steps.  Use mobility aids as needed, such as canes, walkers, scooters, and crutches.  Turn on lights if it is dark. Replace any light bulbs that burn out.  Set up furniture so that there are clear paths. Keep the furniture in the same spot.  Fix any uneven floor surfaces.  Choose a Loss adjuster, chartered that  does not hide the edge of steps of a stairway.  Be aware of any and all pets.  Review your medicines with your healthcare provider. Some medicines can cause dizziness or changes in blood pressure, which increase your risk of falling. Talk with your health care provider about other ways that you can decrease your risk of falls. This may include working with a physical therapist or trainer to improve your strength, balance, and endurance.   This information is not intended to replace advice given to you by your health care provider. Make sure you discuss any questions you have with your health care provider.   Document Released: 06/26/2002 Document Revised: 11/20/2014 Document Reviewed: 08/10/2014 Elsevier Interactive Patient Education [employer].

## 2015-06-21 NOTE — Progress Notes (Signed)
Patient ID: Betty GunnelsBarbara A Keller, female   DOB: 1936-02-06, 79 y.o.   MRN: [id number]    Subjective:   Betty GunnelsBarbara A Keller is a 79 y.o. white female who presents for an Initial Medicare Annual Wellness Visit.  Patient is pleasant and cooperative however toward the end of the visit (about 30 minutes in) she becomes a little anxious because her son brought her and she state he has to get back to his shop / work.   Patient is widowed (husband died 02/2014) but her son lives with her.  She states that she cooks often for both her son and her neighbor.  She attends church regularly and has good social support from both her family and church community.   Current Medications (verified) Outpatient Encounter Prescriptions as of 06/21/2015  Medication Sig  . budesonide-formoterol (SYMBICORT) 160-4.5 MCG/ACT inhaler Inhale 2 puffs into the lungs 2 (two) times daily.  . Cholecalciferol (VITAMIN D) 2000 UNITS CAPS Take 4,000 Units by mouth daily. One daily  . Flaxseed, Linseed, (FLAX SEED OIL PO) Take 1 capsule by mouth daily.  . furosemide (LASIX) 40 MG tablet Take 1 tablet (40 mg total) by mouth daily.  . Multiple Vitamins-Minerals (ICAPS AREDS 2 PO) Take by mouth.  . mupirocin ointment (BACTROBAN) 2 % Apply 1 application topically 3 (three) times daily.  . nortriptyline (PAMELOR) 75 MG capsule Take 1 capsule (75 mg total) by mouth at bedtime.  . ranitidine (ZANTAC) 150 MG tablet Take 1 tablet (150 mg total) by mouth 2 (two) times daily.  . simvastatin (ZOCOR) 40 MG tablet Take 1 tablet (40 mg total) by mouth at bedtime.  . triamcinolone cream (KENALOG) 0.1 % Apply topically 3 (three) times daily.  Marland Kitchen. lisinopril-hydrochlorothiazide (PRINZIDE,ZESTORETIC) 10-12.5 MG per tablet Take 1 tablet by mouth daily.  . metolazone (ZAROXOLYN) 2.5 MG tablet Take one tablet on Monday, Wednesday and Friday (Patient not taking: Reported on 06/21/2015)  . [DISCONTINUED] cephALEXin (KEFLEX) 500 MG capsule Take 1 capsule (500 mg total) by  mouth 2 (two) times daily. (Patient not taking: Reported on 06/21/2015)  . [DISCONTINUED] fish oil-omega-3 fatty acids 1000 MG capsule Take 2 g by mouth daily.  . [DISCONTINUED] levothyroxine (SYNTHROID, LEVOTHROID) 50 MCG tablet Take 1 tablet (50 mcg total) by mouth daily before breakfast. (Patient not taking: Reported on 06/21/2015)   No facility-administered encounter medications on file as of 06/21/2015.    Allergies (verified) Alprazolam; Buspar; and Lorazepam   History: Past Medical History  Diagnosis Date  . GERD (gastroesophageal reflux disease)   . Esophagitis   . Hypertension   . IBS (irritable bowel syndrome)   . Osteoarthritis     shoulders   . Paresis (HCC)     rt. pelvis   . History of obesity     lost 100 lbs  . Hypotonic bladder   . Rectocele     distal ; with outlet obstruction   . Insomnia   . Asthma   . Premature ventricular contraction   . BBB (bundle branch block)     left   . Osteopenia   . Hemidiaphragm paralysis   . Vitamin D deficiency   . Hyperlipidemia   . Thyroid disease     hypothyroidism  . Fibrocystic breast    Past Surgical History  Procedure Laterality Date  . Cataract extraction  1999  . Right carpal tunnel surgery x3  1996  . L-s spine surgery  1980  . Vesicovaginal fistula closure w/ tah  1970  Dr. Perlie Gold    . Eye surgery    . Vaginal hysterectomy Bilateral   . Breast fibroadenoma surgery Right    Family History  Problem Relation Age of Onset  . Heart disease Mother     Unspecified.  Died age 44  . Breast cancer Mother   . Heart attack Father     Enlarged heart.    . Hyperlipidemia Brother   . Heart disease Brother   . Breast cancer      Family History   . Asthma Son    Social History   Occupational History  . Not on file.   Social History Main Topics  . Smoking status: Former Smoker    Start date: 04/05/1972    Quit date: 07/20/1981  . Smokeless tobacco: Never Used  . Alcohol Use: No  . Drug Use: No  .  Sexual Activity: No    Do you feel safe at home?  Yes  Dietary issues and exercise activities: Current Exercise Habits:: Exercise is limited by, Limited by:: orthopedic condition(s)  Current Dietary habits:  Patient has lost about 17# over the last 6 months.  She has been limiting her portion sizes and trying to increase fruits and vegetables.    Objective:    Today's Vitals   06/21/15 1243  BP: 126/72  Pulse: 68  Height: 5' 3.5" (1.613 m)  Weight: 217 lb (98.431 kg)  PainSc: 2   PainLoc: Leg   Body mass index is 37.83 kg/(m^2).   Edema - 2+ bilaterally which has improved.  Does not apprear to be any dermatitis currently. She completed cephalexin.   Activities of Daily Living In your present state of health, do you have any difficulty performing the following activities: 06/21/2015  Hearing? N  Vision? N  Difficulty concentrating or making decisions? N  Walking or climbing stairs? Y  Dressing or bathing? Y  Doing errands, shopping? N  Preparing Food and eating ? N  Using the Toilet? N  In the past six months, have you accidently leaked urine? N  Do you have problems with loss of bowel control? N  Managing your Medications? N  Managing your Finances? N  Housekeeping or managing your Housekeeping? N    Are there smokers in your home (other than you)? No   Cardiac Risk Factors include: advanced age (>41men, >82 women);dyslipidemia;hypertension;obesity (BMI >30kg/m2);sedentary lifestyle  Depression Screen PHQ 2/9 Scores 06/21/2015 06/01/2014 10/31/2013 08/03/2013  PHQ - 2 Score 0 1 0 0    Fall Risk Fall Risk  06/21/2015 06/01/2014 10/31/2013 08/03/2013  Falls in the past year? Yes No No No  Number falls in past yr: 2 or more - - -  Injury with Fall? No - - -  Risk Factor Category  High Fall Risk - - -  Risk for fall due to : History of fall(s);Impaired balance/gait;Impaired mobility - - -  Follow up Falls evaluation completed;Falls prevention discussed - - -     Cognitive Function: MMSE - Mini Mental State Exam 06/21/2015  Orientation to time 5  Orientation to Place 5  Registration 3  Attention/ Calculation 1  Recall 3  Language- name 2 objects 1  Language- repeat 1  Language- follow 3 step command 3  Language- read & follow direction 1  Write a sentence 1  Copy design 0  Copy design-comments patient refused "I can't draw well"  Total score 24    Immunizations and Health Maintenance Immunization History  Administered Date(s) Administered  .  Influenza Whole 04/17/2010  . Influenza,inj,Quad PF,36+ Mos 08/03/2013  . Td 11/18/2002   There are no preventive care reminders to display for this patient.  Patient Care Team: Bennie Pierini, FNP as PCP - General (Nurse Practitioner) Derryl Harbor, OD as Consulting Physician (Optometry)  Indicate any recent Medical Services you may have received from other than [facility] providers in the past year (date may be approximate).    Assessment:    1.  Annual Wellness Visit  2.  Edema - stable currently and has appt to see Dr Cleatis Polka to reevaluate 07/10/2015 3.  History of thyroid disease - patient reports that she has not taken levothyroxine in last 6 months and last thyroid panel was 02/2014 4.  Hyperlipidemia - taking statin and tolerating well  5.  Osteopenia - last DEXA greater than 2 years ago  Screening Tests Health Maintenance  Topic Date Due  . DEXA SCAN  07/10/2015 (Originally 1935/08/08)  . ZOSTAVAX  07/26/2015 (Originally 01/31/1998)  . TETANUS/TDAP  09/20/2015 (Originally 11/17/2012)  . PNA vac Low Risk Adult (1 of 2 - PCV13) 09/20/2015 (Originally 02/01/2003)  . INFLUENZA VACCINE  02/21/2016 (Originally 02/18/2015)  . MAMMOGRAM  05/19/2017        Plan:   During the course of the visit Alease was educated and counseled about the following appropriate screening and preventive services:   Vaccines to include Pneumoccal, Influenza, Hepatitis B, Td, Zostavax - patient refuses  all vaccines recommended today.  Colorectal cancer screening - last colonoscopy several years ago.  Refuses colonoscopy but she was given FOBT to return to office.   Cardiovascular disease screening - BP was at goal today.  Patient is due to have lipids rechecked but she refused to have checked today because she does not have time  Diabetes screening - last FBG was 89  Bone Denisty / Osteoporosis Screening - recommended DEXA today but patient refused because she did not have time.  I ordered for next visit 07/10/2015.   Mammogram - UTD  Glaucoma screening /  Eye Exam - UTD, next due 08/2015  Nutrition counseling - patient is doing a great job limiting salt and portion sizes.  She has lost 17# which might be related to improved edema / diuretics and diet changes.   Advanced Directives - patient states her family knows her wishes but she declined written information for advanced directives.  Recommend recheck thyroid panel since patient has a history of thyroid disease and has stopped levothyroxine. Tried to get her to let me check today but she refused and will check with labs on 07/10/2015.     Continue to use cane to ambulate.  Discussed fall prevention and information / check list given to patient.  I offered referral for balance assessment by physical therapy but patient refused.   Gave information about chair exercises that patient should try to do daily to help with core and leg strength.      Patient Instructions (the written plan) were given to the patient.   Henrene Pastor, [facility]   06/21/2015

## 2015-07-10 ENCOUNTER — Other Ambulatory Visit: Payer: Self-pay | Admitting: Family Medicine

## 2015-07-10 ENCOUNTER — Other Ambulatory Visit: Payer: Self-pay

## 2015-07-10 ENCOUNTER — Encounter: Payer: Self-pay | Admitting: Family Medicine

## 2015-07-10 ENCOUNTER — Ambulatory Visit (INDEPENDENT_AMBULATORY_CARE_PROVIDER_SITE_OTHER): Payer: Medicare Other | Admitting: Family Medicine

## 2015-07-10 VITALS — BP 133/70 | HR 80 | Temp 97.5°F | Ht 63.5 in | Wt 218.4 lb

## 2015-07-10 DIAGNOSIS — I872 Venous insufficiency (chronic) (peripheral): Secondary | ICD-10-CM | POA: Diagnosis not present

## 2015-07-10 DIAGNOSIS — E079 Disorder of thyroid, unspecified: Secondary | ICD-10-CM | POA: Diagnosis not present

## 2015-07-10 DIAGNOSIS — I10 Essential (primary) hypertension: Secondary | ICD-10-CM | POA: Diagnosis not present

## 2015-07-10 DIAGNOSIS — R601 Generalized edema: Secondary | ICD-10-CM

## 2015-07-10 DIAGNOSIS — E039 Hypothyroidism, unspecified: Secondary | ICD-10-CM

## 2015-07-10 DIAGNOSIS — E669 Obesity, unspecified: Secondary | ICD-10-CM | POA: Diagnosis not present

## 2015-07-10 DIAGNOSIS — R600 Localized edema: Secondary | ICD-10-CM | POA: Diagnosis not present

## 2015-07-10 DIAGNOSIS — L03119 Cellulitis of unspecified part of limb: Secondary | ICD-10-CM

## 2015-07-10 DIAGNOSIS — Z780 Asymptomatic menopausal state: Secondary | ICD-10-CM

## 2015-07-10 DIAGNOSIS — E785 Hyperlipidemia, unspecified: Secondary | ICD-10-CM

## 2015-07-10 MED ORDER — METOLAZONE 2.5 MG PO TABS: ORAL_TABLET | ORAL | Status: DC

## 2015-07-10 NOTE — Addendum Note (Signed)
Addended by: Margurite AuerbachOMPTON, Temesha Queener G on: 07/10/2015 05:01 PM   Modules accepted: Orders

## 2015-07-10 NOTE — Progress Notes (Signed)
   Subjective:    Patient ID: Betty Keller, female    DOB: 1935-09-07, 79 y.o.   MRN: [id number]  HPI 79 year old female who is here follow-up dependent edema, hyperlipidemia. She has she has reflux and takes Zantac when necessary. She really is doing very well on her diet. She went into great detail about the amount of candy she's had during this holiday season. She uses Symbicort on an as-needed basis for her breathing. As long she sleeps under the ceiling when her breathing is good.    Review of Systems  Constitutional: Negative.   Respiratory: Negative.   Cardiovascular: Negative.   Psychiatric/Behavioral: Negative.       BP 133/70 mmHg  Pulse 80  Temp(Src) 97.5 F (36.4 C) (Oral)  Ht 5' 3.5" (1.613 m)  Wt 218 lb 6 oz (99.054 kg)  BMI 38.07 kg/m2  Objective:   Physical Exam  Constitutional: She is oriented to person, place, and time. She appears well-developed and well-nourished.  Cardiovascular: Normal rate and regular rhythm.   Pulmonary/Chest: Effort normal and breath sounds normal.  Musculoskeletal: She exhibits edema.  Neurological: She is alert and oriented to person, place, and time.          Assessment & Plan:  1. Essential hypertension Blood pressure is well controlled on Zestoretic  2. Chronic venous insufficiency Legs are still swollen but are improved. There is still erythema and stasis dermatitis  3. Thyroid disease TSH was checked over a year ago needs to be done today  4. Hypothyroidism, unspecified hypothyroidism type See above  5. Obesity Watching her diet.  6. Hyperlipidemia Lipids will be checked today. Apparently tolerating statin well - Lipid panel - CMP14+EGFR  7. Pedal edema To chronic venous insufficiency symptoms are pretty well managed with combination of loop diurretic and metolazone  Wardell Honour MD

## 2015-07-11 LAB — CMP14+EGFR
ALBUMIN: 4 g/dL (ref 3.5–4.8)
ALK PHOS: 102 IU/L (ref 39–117)
ALT: 20 IU/L (ref 0–32)
AST: 18 IU/L (ref 0–40)
Albumin/Globulin Ratio: 1.5 (ref 1.1–2.5)
BILIRUBIN TOTAL: 0.3 mg/dL (ref 0.0–1.2)
BUN / CREAT RATIO: 14 (ref 11–26)
BUN: 12 mg/dL (ref 8–27)
CHLORIDE: 97 mmol/L (ref 96–106)
CO2: 28 mmol/L (ref 18–29)
Calcium: 9.4 mg/dL (ref 8.7–10.3)
Creatinine, Ser: 0.88 mg/dL (ref 0.57–1.00)
GFR calc Af Amer: 73 mL/min/{1.73_m2} (ref >59)
GFR calc non Af Amer: 64 mL/min/{1.73_m2} (ref >59)
GLUCOSE: 97 mg/dL (ref 65–99)
Globulin, Total: 2.6 g/dL (ref 1.5–4.5)
Potassium: 4.3 mmol/L (ref 3.5–5.2)
Sodium: 139 mmol/L (ref 134–144)
Total Protein: 6.6 g/dL (ref 6.0–8.5)

## 2015-07-11 LAB — LIPID PANEL
CHOLESTEROL TOTAL: 156 mg/dL (ref 100–199)
Chol/HDL Ratio: 2.1 ratio units (ref 0.0–4.4)
HDL: 73 mg/dL (ref >39)
LDL Calculated: 71 mg/dL (ref 0–99)
TRIGLYCERIDES: 59 mg/dL (ref 0–149)
VLDL Cholesterol Cal: 12 mg/dL (ref 5–40)

## 2015-07-17 ENCOUNTER — Other Ambulatory Visit: Payer: Self-pay | Admitting: Family Medicine

## 2015-07-17 ENCOUNTER — Telehealth: Payer: Self-pay | Admitting: Family Medicine

## 2015-07-17 ENCOUNTER — Other Ambulatory Visit: Payer: Self-pay | Admitting: *Deleted

## 2015-07-17 MED ORDER — MUPIROCIN 2 % EX OINT
1.0000 | TOPICAL_OINTMENT | Freq: Three times a day (TID) | CUTANEOUS | Status: DC
Start: 2015-07-17 — End: 2015-08-27

## 2015-07-17 MED ORDER — TRIAMCINOLONE ACETONIDE 0.1 % EX CREA: TOPICAL_CREAM | Freq: Three times a day (TID) | CUTANEOUS | Status: DC

## 2015-07-17 NOTE — Addendum Note (Signed)
Addended by: Fawn KirkHOLT, CATHY on: 07/17/2015 05:22 PM   Modules accepted: Orders

## 2015-07-17 NOTE — Telephone Encounter (Signed)
Pt notified of RXs 

## 2015-07-17 NOTE — Telephone Encounter (Signed)
Rx refilled per patient request 

## 2015-08-08 ENCOUNTER — Encounter: Payer: Self-pay | Admitting: Family Medicine

## 2015-08-08 ENCOUNTER — Ambulatory Visit (INDEPENDENT_AMBULATORY_CARE_PROVIDER_SITE_OTHER): Payer: Medicare Other | Admitting: Family Medicine

## 2015-08-08 VITALS — BP 125/65 | HR 82 | Temp 97.6°F | Ht 63.5 in | Wt 219.2 lb

## 2015-08-08 DIAGNOSIS — S81802A Unspecified open wound, left lower leg, initial encounter: Secondary | ICD-10-CM

## 2015-08-08 NOTE — Progress Notes (Signed)
   Subjective:    Patient ID: Betty Keller, female    DOB: 05-Nov-1935, 80 y.o.   MRN: [id number]  HPI 80 year old female with chronic venous insufficiency and edema involving both legs. Earlier this week she hit her lower leg getting into a car and has a small open wound. She was told by some friends that she might need an Radio broadcast assistant to facilitate healing. Daughter has wrapped her leg with goals wrap since this occurred. She are read takes furosemide and metolazone for her dependent edema  Patient Active Problem List   Diagnosis Date Noted  . ERRONEOUS ENCOUNTER--DISREGARD 11/02/2013  . Hypothyroid 09/18/2013  . Bronchitis, chronic obstructive w acute bronchitis (HCC) 06/29/2013  . COPD (chronic obstructive pulmonary disease) (HCC) 06/27/2013  . Chest congestion 06/27/2013  . Thyroid disease   . Acute upper respiratory infections of unspecified site 03/28/2013  . Hyperlipidemia   . Unspecified vitamin D deficiency 01/24/2013  . Cerumen impaction 11/15/2012  . Chronic venous insufficiency 11/15/2012  . Pedal edema 11/15/2012  . GERD (gastroesophageal reflux disease) 10/13/2010  . HTN (hypertension) 10/13/2010  . IBS (irritable bowel syndrome) 10/13/2010  . Osteoarthritis of shoulder 10/13/2010  . Obesity 10/13/2010  . Rectocele 10/13/2010  . Insomnia 10/13/2010  . Bundle branch block, left 10/13/2010  . Asthma 10/13/2010   Outpatient Encounter Prescriptions as of 08/08/2015  Medication Sig  . budesonide-formoterol (SYMBICORT) 160-4.5 MCG/ACT inhaler Inhale 2 puffs into the lungs 2 (two) times daily.  . Cholecalciferol (VITAMIN D) 2000 UNITS CAPS Take 4,000 Units by mouth daily. One daily  . Flaxseed, Linseed, (FLAX SEED OIL PO) Take 1 capsule by mouth daily.  . furosemide (LASIX) 40 MG tablet Take 1 tablet (40 mg total) by mouth daily.  Marland Kitchen lisinopril-hydrochlorothiazide (PRINZIDE,ZESTORETIC) 10-12.5 MG per tablet Take 1 tablet by mouth daily.  . metolazone (ZAROXOLYN) 2.5 MG tablet  Take one tablet on Monday, Wednesday and Friday  . Multiple Vitamins-Minerals (ICAPS AREDS 2 PO) Take by mouth.  . mupirocin ointment (BACTROBAN) 2 % Apply 1 application topically 3 (three) times daily.  . nortriptyline (PAMELOR) 75 MG capsule Take 1 capsule (75 mg total) by mouth at bedtime.  . ranitidine (ZANTAC) 150 MG tablet Take 1 tablet (150 mg total) by mouth 2 (two) times daily.  . simvastatin (ZOCOR) 40 MG tablet Take 1 tablet (40 mg total) by mouth at bedtime.  . triamcinolone cream (KENALOG) 0.1 % Apply topically 3 (three) times daily.   No facility-administered encounter medications on file as of 08/08/2015.      Review of Systems  Constitutional: Negative.   Respiratory: Negative.   Cardiovascular: Positive for leg swelling.  Genitourinary: Negative.   Neurological: Negative.   Psychiatric/Behavioral: Negative.        Objective:   Physical Exam  Constitutional: She appears well-developed and well-nourished.  Musculoskeletal: She exhibits edema.  Left lower leg is swollen but not as much as the right. There is a small dime size open area on the anterior leg. There does not appear to be any cellulitis or infection or drainage.          Assessment & Plan:  1. Wound of left leg, initial encounter To the edema believe this wound needs help with healing. Will apply Unna boot first twice a week and going to every week until healed this was done today. Goal will be to have daughter doing this eventually. Continue with diuretics and elevation  Frederica Kuster MD

## 2015-08-12 ENCOUNTER — Encounter: Payer: Self-pay | Admitting: Family Medicine

## 2015-08-12 ENCOUNTER — Ambulatory Visit (INDEPENDENT_AMBULATORY_CARE_PROVIDER_SITE_OTHER): Payer: Medicare Other | Admitting: Family Medicine

## 2015-08-12 VITALS — BP 142/83 | HR 73 | Temp 97.7°F | Ht 63.5 in | Wt 218.0 lb

## 2015-08-12 DIAGNOSIS — I872 Venous insufficiency (chronic) (peripheral): Secondary | ICD-10-CM | POA: Diagnosis not present

## 2015-08-12 DIAGNOSIS — H6122 Impacted cerumen, left ear: Secondary | ICD-10-CM | POA: Diagnosis not present

## 2015-08-12 MED ORDER — AMOXICILLIN 875 MG PO TABS: 875.0000 mg | ORAL_TABLET | Freq: Two times a day (BID) | ORAL | Status: DC

## 2015-08-12 NOTE — Progress Notes (Signed)
Subjective:    Patient ID: Betty Keller, female    DOB: Oct 30, 1935, 80 y.o.   MRN: [id number]  HPI Patient here today for follow up on left leg wound. She also complains that her left ear is stopped up. Leg was wrapped 3 days ago. Been no problems or drainage. She returns today for follow-up. Also complaining of: Stopped up left ear    Patient Active Problem List   Diagnosis Date Noted  . ERRONEOUS ENCOUNTER--DISREGARD 11/02/2013  . Hypothyroid 09/18/2013  . Bronchitis, chronic obstructive w acute bronchitis (HCC) 06/29/2013  . COPD (chronic obstructive pulmonary disease) (HCC) 06/27/2013  . Chest congestion 06/27/2013  . Thyroid disease   . Acute upper respiratory infections of unspecified site 03/28/2013  . Hyperlipidemia   . Unspecified vitamin D deficiency 01/24/2013  . Cerumen impaction 11/15/2012  . Chronic venous insufficiency 11/15/2012  . Pedal edema 11/15/2012  . GERD (gastroesophageal reflux disease) 10/13/2010  . HTN (hypertension) 10/13/2010  . IBS (irritable bowel syndrome) 10/13/2010  . Osteoarthritis of shoulder 10/13/2010  . Obesity 10/13/2010  . Rectocele 10/13/2010  . Insomnia 10/13/2010  . Bundle branch block, left 10/13/2010  . Asthma 10/13/2010   Outpatient Encounter Prescriptions as of 08/12/2015  Medication Sig  . budesonide-formoterol (SYMBICORT) 160-4.5 MCG/ACT inhaler Inhale 2 puffs into the lungs 2 (two) times daily.  . Cholecalciferol (VITAMIN D) 2000 UNITS CAPS Take 4,000 Units by mouth daily. One daily  . Flaxseed, Linseed, (FLAX SEED OIL PO) Take 1 capsule by mouth daily.  . furosemide (LASIX) 40 MG tablet Take 1 tablet (40 mg total) by mouth daily.  Marland Kitchen lisinopril-hydrochlorothiazide (PRINZIDE,ZESTORETIC) 10-12.5 MG per tablet Take 1 tablet by mouth daily.  . metolazone (ZAROXOLYN) 2.5 MG tablet Take one tablet on Monday, Wednesday and Friday  . Multiple Vitamins-Minerals (ICAPS AREDS 2 PO) Take by mouth.  . mupirocin ointment (BACTROBAN) 2  % Apply 1 application topically 3 (three) times daily.  . nortriptyline (PAMELOR) 75 MG capsule Take 1 capsule (75 mg total) by mouth at bedtime.  . ranitidine (ZANTAC) 150 MG tablet Take 1 tablet (150 mg total) by mouth 2 (two) times daily.  . simvastatin (ZOCOR) 40 MG tablet Take 1 tablet (40 mg total) by mouth at bedtime.  . triamcinolone cream (KENALOG) 0.1 % Apply topically 3 (three) times daily.   No facility-administered encounter medications on file as of 08/12/2015.      Review of Systems  Constitutional: Negative.   HENT: Positive for ear pain (left ear).   Eyes: Negative.   Respiratory: Negative.   Cardiovascular: Negative.   Gastrointestinal: Negative.   Endocrine: Negative.   Genitourinary: Negative.   Musculoskeletal: Negative.   Skin: Positive for wound (left lower leg).  Allergic/Immunologic: Negative.   Neurological: Negative.   Hematological: Negative.   Psychiatric/Behavioral: Negative.        Objective:   Physical Exam  Constitutional: She appears well-developed and well-nourished.  HENT:  Left ear occluded with cerumen. This will be irrigated  Skin:  Small wound has improved and leg overall looks better. We'll continue with an Radio broadcast assistant.    BP 142/83 mmHg  Pulse 73  Temp(Src) 97.7 F (36.5 C) (Oral)  Ht 5' 3.5" (1.613 m)  Wt 218 lb (98.884 kg)  BMI 38.01 kg/m2       Assessment & Plan:  1. Cerumen impaction, left Successfully irrigated but tympanic membrane is red suggestive of infection will cover with amoxicillin  2. Chronic venous insufficiency   Frederica Kuster  MDWound has improved continue with wraps once a week will recheck her in 2 weeks

## 2015-08-19 ENCOUNTER — Telehealth: Payer: Self-pay | Admitting: Family Medicine

## 2015-08-19 NOTE — Telephone Encounter (Signed)
Patient states her leg looks better and I advised that if it continued to bleed she would need to be seen.

## 2015-08-27 ENCOUNTER — Ambulatory Visit: Payer: Medicare Other | Admitting: Family Medicine

## 2015-08-27 ENCOUNTER — Encounter: Payer: Self-pay | Admitting: Family Medicine

## 2015-08-27 ENCOUNTER — Ambulatory Visit (INDEPENDENT_AMBULATORY_CARE_PROVIDER_SITE_OTHER): Payer: Medicare Other | Admitting: Family Medicine

## 2015-08-27 ENCOUNTER — Other Ambulatory Visit: Payer: Self-pay | Admitting: Family Medicine

## 2015-08-27 VITALS — BP 129/79 | HR 75 | Temp 97.4°F | Ht 63.5 in | Wt 215.0 lb

## 2015-08-27 DIAGNOSIS — I872 Venous insufficiency (chronic) (peripheral): Secondary | ICD-10-CM

## 2015-08-27 DIAGNOSIS — H6121 Impacted cerumen, right ear: Secondary | ICD-10-CM | POA: Diagnosis not present

## 2015-08-27 MED ORDER — MUPIROCIN 2 % EX OINT: 1.0000 "application " | TOPICAL_OINTMENT | Freq: Three times a day (TID) | CUTANEOUS | Status: DC

## 2015-08-27 NOTE — Progress Notes (Signed)
Subjective:    Patient ID: Betty Keller, female    DOB: 1935-11-07, 80 y.o.   MRN: [id number]  HPI Patient here today for 2 week follow up on leg wounds.  Leg have been wrapped with an Unna boot and now look much better. There is some scabbing of the small wound. I think if we wrap it today and leave the wrap on the rest of this week that it can be removed and healing should complete itself. She is also complaining of some discomfort in her right ear. The left ear was irrigated several weeks ago and I think the right one needs to same today.      Patient Active Problem List   Diagnosis Date Noted  . ERRONEOUS ENCOUNTER--DISREGARD 11/02/2013  . Hypothyroid 09/18/2013  . Bronchitis, chronic obstructive w acute bronchitis (HCC) 06/29/2013  . COPD (chronic obstructive pulmonary disease) (HCC) 06/27/2013  . Chest congestion 06/27/2013  . Thyroid disease   . Acute upper respiratory infections of unspecified site 03/28/2013  . Hyperlipidemia   . Unspecified vitamin D deficiency 01/24/2013  . Cerumen impaction 11/15/2012  . Chronic venous insufficiency 11/15/2012  . Pedal edema 11/15/2012  . GERD (gastroesophageal reflux disease) 10/13/2010  . HTN (hypertension) 10/13/2010  . IBS (irritable bowel syndrome) 10/13/2010  . Osteoarthritis of shoulder 10/13/2010  . Obesity 10/13/2010  . Rectocele 10/13/2010  . Insomnia 10/13/2010  . Bundle branch block, left 10/13/2010  . Asthma 10/13/2010   Outpatient Encounter Prescriptions as of 08/27/2015  Medication Sig  . budesonide-formoterol (SYMBICORT) 160-4.5 MCG/ACT inhaler Inhale 2 puffs into the lungs 2 (two) times daily.  . Cholecalciferol (VITAMIN D) 2000 UNITS CAPS Take 4,000 Units by mouth daily. One daily  . Flaxseed, Linseed, (FLAX SEED OIL PO) Take 1 capsule by mouth daily.  . furosemide (LASIX) 40 MG tablet Take 1 tablet (40 mg total) by mouth daily.  Marland Kitchen lisinopril-hydrochlorothiazide (PRINZIDE,ZESTORETIC) 10-12.5 MG per tablet Take  1 tablet by mouth daily.  . metolazone (ZAROXOLYN) 2.5 MG tablet Take one tablet on Monday, Wednesday and Friday  . Multiple Vitamins-Minerals (ICAPS AREDS 2 PO) Take by mouth.  . mupirocin ointment (BACTROBAN) 2 % Apply 1 application topically 3 (three) times daily.  . nortriptyline (PAMELOR) 75 MG capsule Take 1 capsule (75 mg total) by mouth at bedtime.  . ranitidine (ZANTAC) 150 MG tablet Take 1 tablet (150 mg total) by mouth 2 (two) times daily.  . simvastatin (ZOCOR) 40 MG tablet Take 1 tablet (40 mg total) by mouth at bedtime.  . triamcinolone cream (KENALOG) 0.1 % Apply topically 3 (three) times daily.  . [DISCONTINUED] amoxicillin (AMOXIL) 875 MG tablet Take 1 tablet (875 mg total) by mouth 2 (two) times daily.   No facility-administered encounter medications on file as of 08/27/2015.      Review of Systems  Constitutional: Negative.   HENT: Negative.   Eyes: Negative.   Respiratory: Negative.   Cardiovascular: Negative.   Gastrointestinal: Negative.   Endocrine: Negative.   Genitourinary: Negative.   Musculoskeletal: Negative.   Skin: Negative.        Left lower extremity  - wound / red  Allergic/Immunologic: Negative.   Neurological: Negative.   Hematological: Negative.   Psychiatric/Behavioral: Negative.       Objective:   Physical Exam  HENT:  Right external auditory canal is occluded with cerumen and will be irrigated  Skin:  Left leg: There is some erythema but no edema wounds are basically healed with scabbing present.  BP 129/79 mmHg  Pulse 75  Temp(Src) 97.4 F (36.3 C) (Oral)  Ht 5' 3.5" (1.613 m)  Wt 215 lb (97.523 kg)  BMI 37.48 kg/m2        Assessment & Plan:  1. Chronic venous insufficiency Leg has improved. We'll wrap today with an Radio broadcast assistant and follow as needed.  2. Cerumen impaction,  Right ear to be irrigated  Frederica Kuster MD Left ear to be irrigated

## 2015-09-09 ENCOUNTER — Other Ambulatory Visit: Payer: Self-pay | Admitting: Family Medicine

## 2015-09-23 ENCOUNTER — Ambulatory Visit (INDEPENDENT_AMBULATORY_CARE_PROVIDER_SITE_OTHER): Payer: Medicare Other | Admitting: Nurse Practitioner

## 2015-09-23 ENCOUNTER — Encounter: Payer: Self-pay | Admitting: Nurse Practitioner

## 2015-09-23 VITALS — BP 132/66 | HR 90 | Temp 97.6°F | Ht 63.5 in | Wt 251.4 lb

## 2015-09-23 DIAGNOSIS — J0101 Acute recurrent maxillary sinusitis: Secondary | ICD-10-CM

## 2015-09-23 DIAGNOSIS — H65191 Other acute nonsuppurative otitis media, right ear: Secondary | ICD-10-CM | POA: Diagnosis not present

## 2015-09-23 MED ORDER — AMOXICILLIN 875 MG PO TABS: 875.0000 mg | ORAL_TABLET | Freq: Two times a day (BID) | ORAL | Status: DC

## 2015-09-23 NOTE — Patient Instructions (Signed)

## 2015-09-23 NOTE — Progress Notes (Signed)
  Subjective:     Betty GunnelsBarbara A Plaugher is a 80 y.o. female who presents for evaluation of sinus pain. Symptoms include: congestion, cough, headaches, puffiness of the eyes and sinus pressure. Onset of symptoms was 3 days ago. Symptoms have been gradually worsening since that time. Past history is significant for no history of pneumonia or bronchitis. Patient is a non-smoker.  The following portions of the patient's history were reviewed and updated as appropriate: allergies, current medications, past family history, past medical history, past social history, past surgical history and problem list.  Review of Systems Pertinent items are noted in HPI.   Objective:    BP 132/66 mmHg  Pulse 90  Temp(Src) 97.6 F (36.4 C) (Oral)  Ht 5' 3.5" (1.613 m)  Wt 251 lb 6.4 oz (114.034 kg)  BMI 43.83 kg/m2 General appearance: alert and cooperative Eyes: conjunctivae/corneas clear. PERRL, EOM's intact. Fundi benign. Ears: normal TM and external ear canal left ear and abnormal TM right ear - erythematous and bulging Nose: clear discharge, moderate congestion, sinus tenderness bilateral Throat: lips, mucosa, and tongue normal; teeth and gums normal Neck: no adenopathy, no carotid bruit, no JVD, supple, symmetrical, trachea midline and thyroid not enlarged, symmetric, no tenderness/mass/nodules Lungs: clear to auscultation bilaterally and deep dry cough Heart: regular rate and rhythm, S1, S2 normal, no murmur, click, rub or gallop    Assessment:    Acute bacterial sinusitis and right otitis media    Plan:  1. Take meds as prescribed 2. Use a cool mist humidifier especially during the winter months and when heat has been humid. 3. Use saline nose sprays frequently 4. Saline irrigations of the nose can be very helpful if done frequently.  * 4X daily for 1 week*  * Use of a nettie pot can be helpful with this. Follow directions with this* 5. Drink plenty of fluids 6. Keep thermostat turn down low 7.For  any cough or congestion  Use plain Mucinex- regular strength or max strength is fine   * Children- consult with Pharmacist for dosing 8. For fever or aces or pains- take tylenol or ibuprofen appropriate for age and weight.  * for fevers greater than 101 orally you may alternate ibuprofen and tylenol every  3 hours.   Meds ordered this encounter  Medications  . amoxicillin (AMOXIL) 875 MG tablet    Sig: Take 1 tablet (875 mg total) by mouth 2 (two) times daily. 1 po BID    Dispense:  20 tablet    Refill:  0    Order Specific Question:  Supervising Provider    Answer:  Ernestina PennaMOORE, DONALD W [[id number]   Mary-Margaret Daphine DeutscherMartin, FNP

## 2015-09-26 ENCOUNTER — Encounter: Payer: Self-pay | Admitting: Family

## 2015-09-26 ENCOUNTER — Ambulatory Visit (INDEPENDENT_AMBULATORY_CARE_PROVIDER_SITE_OTHER): Payer: Medicare Other | Admitting: Family

## 2015-09-26 VITALS — BP 123/67 | HR 84 | Temp 98.8°F | Ht 63.5 in | Wt 217.0 lb

## 2015-09-26 DIAGNOSIS — E7800 Pure hypercholesterolemia, unspecified: Secondary | ICD-10-CM | POA: Diagnosis not present

## 2015-09-26 DIAGNOSIS — I639 Cerebral infarction, unspecified: Secondary | ICD-10-CM | POA: Diagnosis not present

## 2015-09-26 DIAGNOSIS — I509 Heart failure, unspecified: Secondary | ICD-10-CM | POA: Diagnosis not present

## 2015-09-26 DIAGNOSIS — G459 Transient cerebral ischemic attack, unspecified: Secondary | ICD-10-CM | POA: Diagnosis not present

## 2015-09-26 DIAGNOSIS — Z23 Encounter for immunization: Secondary | ICD-10-CM | POA: Diagnosis not present

## 2015-09-26 DIAGNOSIS — Z79899 Other long term (current) drug therapy: Secondary | ICD-10-CM | POA: Diagnosis not present

## 2015-09-26 DIAGNOSIS — Z7982 Long term (current) use of aspirin: Secondary | ICD-10-CM | POA: Diagnosis not present

## 2015-09-26 DIAGNOSIS — I110 Hypertensive heart disease with heart failure: Secondary | ICD-10-CM | POA: Diagnosis not present

## 2015-09-26 DIAGNOSIS — J324 Chronic pansinusitis: Secondary | ICD-10-CM | POA: Diagnosis not present

## 2015-09-26 DIAGNOSIS — Z8673 Personal history of transient ischemic attack (TIA), and cerebral infarction without residual deficits: Secondary | ICD-10-CM

## 2015-09-26 DIAGNOSIS — I69854 Hemiplegia and hemiparesis following other cerebrovascular disease affecting left non-dominant side: Secondary | ICD-10-CM | POA: Diagnosis not present

## 2015-09-26 DIAGNOSIS — M6289 Other specified disorders of muscle: Secondary | ICD-10-CM | POA: Diagnosis not present

## 2015-09-26 DIAGNOSIS — J189 Pneumonia, unspecified organism: Secondary | ICD-10-CM | POA: Diagnosis not present

## 2015-09-26 DIAGNOSIS — R531 Weakness: Secondary | ICD-10-CM

## 2015-09-26 DIAGNOSIS — J0140 Acute pansinusitis, unspecified: Secondary | ICD-10-CM | POA: Diagnosis not present

## 2015-09-26 DIAGNOSIS — R609 Edema, unspecified: Secondary | ICD-10-CM

## 2015-09-26 DIAGNOSIS — R7989 Other specified abnormal findings of blood chemistry: Secondary | ICD-10-CM | POA: Diagnosis not present

## 2015-09-26 DIAGNOSIS — Z7951 Long term (current) use of inhaled steroids: Secondary | ICD-10-CM | POA: Diagnosis not present

## 2015-09-26 NOTE — Progress Notes (Signed)
   Subjective:    Patient ID: Betty GunnelsBarbara A Currie, female    DOB: 21-Sep-1935, 80 y.o.   MRN: [id number]  HPI Pt presents to the office today with left leg weakness that started yesterday. PT states she stood up yesterday from her chair had a "wave of numbness over her" and states her left leg just does not seem to be as strong as it usually is. Pt admits she always has "fluid in both legs" and this is unchanged and "maybe a little better". Pt's left leg is erythemas today, but patient states this is her "normal". PT states she started on amoxicillin on Monday for a sinus infection.    Review of Systems  Constitutional: Negative.   HENT: Negative.   Eyes: Negative.   Respiratory: Negative.  Negative for shortness of breath.   Cardiovascular: Negative.  Negative for palpitations.  Gastrointestinal: Negative.   Endocrine: Negative.   Genitourinary: Negative.   Musculoskeletal: Negative.   Neurological: Negative.  Negative for headaches.  Hematological: Negative.   Psychiatric/Behavioral: Negative.   All other systems reviewed and are negative.      Objective:   Physical Exam  Constitutional: She is oriented to person, place, and time. She appears well-developed and well-nourished. No distress.  HENT:  Head: Normocephalic and atraumatic.  Right Ear: External ear normal.  Left Ear: External ear normal.  Nose: Nose normal.  Mouth/Throat: Oropharynx is clear and moist.  Eyes: Pupils are equal, round, and reactive to light.  Neck: Normal range of motion. Neck supple. No thyromegaly present.  Cardiovascular: Normal rate, regular rhythm, normal heart sounds and intact distal pulses.   No murmur heard. Pulmonary/Chest: Effort normal and breath sounds normal. No respiratory distress. She has no wheezes.  Abdominal: Soft. Bowel sounds are normal. She exhibits no distension. There is no tenderness.  Musculoskeletal: Normal range of motion. She exhibits edema (3+ in BLE, erythemas). She exhibits  no tenderness.  Right hand and foot strength 2+ Left hand and foot strength 1+, pt unable to dorsiflex left foot  Neurological: She is alert and oriented to person, place, and time. No cranial nerve deficit.  Skin: Skin is warm and dry.  Psychiatric: She has a normal mood and affect. Her behavior is normal. Judgment and thought content normal.  Vitals reviewed.     BP 123/67 mmHg  Pulse 84  Temp(Src) 98.8 F (37.1 C) (Oral)  Ht 5' 3.5" (1.613 m)  Wt 217 lb (98.431 kg)  BMI 37.83 kg/m2     Assessment & Plan:  1. Left-sided weakness  2. Peripheral edema  3. Hx-TIA (transient ischemic attack)  Pt's speech and nuero exam WNL, but has new onset of left sided weakness. PT unable to flex left foot. Since patient has hx of TIA  I am recommending her to go to the ED to rule out anything since she is at an increase risk of stroke. Discussed falls risks with patient.   Jannifer Rodneyhristy Jonas Goh, FNP

## 2015-09-26 NOTE — Patient Instructions (Signed)
Transient Ischemic Attack °A transient ischemic attack (TIA) is a "warning stroke" that causes stroke-like symptoms. Unlike a stroke, a TIA does not cause permanent damage to the brain. The symptoms of a TIA can happen very fast and do not last long. It is important to know the symptoms of a TIA and what to do. This can help prevent a major stroke or death. °CAUSES  °A TIA is caused by a temporary blockage in an artery in the brain or neck (carotid artery). The blockage does not allow the brain to get the blood supply it needs and can cause different symptoms. The blockage can be caused by either: °· A blood clot. °· Fatty buildup (plaque) in a neck or brain artery. °RISK FACTORS °· High blood pressure (hypertension). °· High cholesterol. °· Diabetes mellitus. °· Heart disease. °· The buildup of plaque in the blood vessels (peripheral artery disease or atherosclerosis). °· The buildup of plaque in the blood vessels that provide blood and oxygen to the brain (carotid artery stenosis). °· An abnormal heart rhythm (atrial fibrillation). °· Obesity. °· Using any tobacco products, including cigarettes, chewing tobacco, or electronic cigarettes. °· Taking oral contraceptives, especially in combination with using tobacco. °· Physical inactivity. °· A diet high in fats, salt (sodium), and calories. °· Excessive alcohol use. °· Use of illegal drugs (especially cocaine and methamphetamine). °· Being female. °· Being African American. °· Being over the age of 55 years. °· Family history of stroke. °· Previous history of blood clots, stroke, TIA, or heart attack. °· Sickle cell disease. °SIGNS AND SYMPTOMS  °TIA symptoms are the same as a stroke but are temporary. These symptoms usually develop suddenly, or may be newly present upon waking from sleep: °· Sudden weakness or numbness of the face, arm, or leg, especially on one side of the body. °· Sudden trouble walking or difficulty moving arms or legs. °· Sudden  confusion. °· Sudden personality changes. °· Trouble speaking (aphasia) or understanding. °· Difficulty swallowing. °· Sudden trouble seeing in one or both eyes. °· Double vision. °· Dizziness. °· Loss of balance or coordination. °· Sudden severe headache with no known cause. °· Trouble reading or writing. °· Loss of bowel or bladder control. °· Loss of consciousness. °DIAGNOSIS  °Your health care provider may be able to determine the presence or absence of a TIA based on your symptoms, history, and physical exam. CT scan of the brain is usually performed to help identify a TIA. Other tests may include: °· Electrocardiography (ECG). °· Continuous heart monitoring. °· Echocardiography. °· Carotid ultrasonography. °· MRI. °· A scan of the brain circulation. °· Blood tests. °TREATMENT  °Since the symptoms of TIA are the same as a stroke, it is important to seek treatment as soon as possible. You may need a medicine to dissolve a blood clot (thrombolytic) if that is the cause of the TIA. This medicine cannot be given if too much time has passed. Treatment may also include:  °· Rest, oxygen, fluids through an IV tube, and medicines to thin the blood (anticoagulants). °· Measures will be taken to prevent short-term and long-term complications, including infection from breathing foreign material into the lungs (aspiration pneumonia), blood clots in the legs, and falls. °· Procedures to either remove plaque in the carotid arteries or dilate carotid arteries that have narrowed due to plaque. Those procedures are: °¨ Carotid endarterectomy. °¨ Carotid angioplasty and stenting. °· Medicines and diet may be used to address diabetes, high blood pressure, and   other underlying risk factors. °HOME CARE INSTRUCTIONS  °· Take medicines only as directed by your health care provider. Follow the directions carefully. Medicines may be used to control risk factors for a stroke. Be sure you understand all your medicine instructions. °· You  may be told to take aspirin or the anticoagulant warfarin. Warfarin needs to be taken exactly as instructed. °¨ Taking too much or too little warfarin is dangerous. Too much warfarin increases the risk of bleeding. Too little warfarin continues to allow the risk for blood clots. While taking warfarin, you will need to have regular blood tests to measure your blood clotting time. A PT blood test measures how long it takes for blood to clot. Your PT is used to calculate another value called an INR. Your PT and INR help your health care provider to adjust your dose of warfarin. The dose can change for many reasons. It is critically important that you take warfarin exactly as prescribed. °¨ Many foods, especially foods high in vitamin K can interfere with warfarin and affect the PT and INR. Foods high in vitamin K include spinach, kale, broccoli, cabbage, collard and turnip greens, Brussels sprouts, peas, cauliflower, seaweed, and parsley, as well as beef and pork liver, green tea, and soybean oil. You should eat a consistent amount of foods high in vitamin K. Avoid major changes in your diet, or notify your health care provider before changing your diet. Arrange a visit with a dietitian to answer your questions. °¨ Many medicines can interfere with warfarin and affect the PT and INR. You must tell your health care provider about any and all medicines you take; this includes all vitamins and supplements. Be especially cautious with aspirin and anti-inflammatory medicines. Do not take or discontinue any prescribed or over-the-counter medicine except on the advice of your health care provider or pharmacist. °¨ Warfarin can have side effects, such as excessive bruising or bleeding. You will need to hold pressure over cuts for longer than usual. Your health care provider or pharmacist will discuss other potential side effects. °¨ Avoid sports or activities that may cause injury or bleeding. °¨ Be careful when shaving,  flossing your teeth, or handling sharp objects. °¨ Alcohol can change the body's ability to handle warfarin. It is best to avoid alcoholic drinks or consume only very small amounts while taking warfarin. Notify your health care provider if you change your alcohol intake. °¨ Notify your dentist or other health care providers before procedures. °· Eat a diet that includes 5 or more servings of fruits and vegetables each day. This may reduce the risk of stroke. Certain diets may be prescribed to address high blood pressure, high cholesterol, diabetes, or obesity. °¨ A diet low in sodium, saturated fat, trans fat, and cholesterol is recommended to manage high blood pressure. °¨ A diet low in saturated fat, trans fat, and cholesterol, and high in fiber may control cholesterol levels. °¨ A controlled-carbohydrate, controlled-sugar diet is recommended to manage diabetes. °¨ A reduced-calorie diet that is low in sodium, saturated fat, trans fat, and cholesterol is recommended to manage obesity. °· Maintain a healthy weight. °· Stay physically active. It is recommended that you get at least 30 minutes of activity on most or all days. °· Do not use any tobacco products, including cigarettes, chewing tobacco, or electronic cigarettes. If you need help quitting, ask your health care provider. °· Limit alcohol intake to no more than 1 drink per day for nonpregnant women and 2 drinks   per day for men. One drink equals 12 ounces of beer, 5 ounces of wine, or 1½ ounces of hard liquor. °· Do not abuse drugs. °· A safe home environment is important to reduce the risk of falls. Your health care provider may arrange for specialists to evaluate your home. Having grab bars in the bedroom and bathroom is often important. Your health care provider may arrange for equipment to be used at home, such as raised toilets and a seat for the shower. °· Follow all instructions for follow-up with your health care provider. This is very important.  This includes any referrals and lab tests. Proper follow-up can prevent a stroke or another TIA from occurring. °PREVENTION  °The risk of a TIA can be decreased by appropriately treating high blood pressure, high cholesterol, diabetes, heart disease, and obesity, and by quitting smoking, limiting alcohol, and staying physically active. °SEEK MEDICAL CARE IF: °· You have personality changes. °· You have difficulty swallowing. °· You are seeing double. °· You have dizziness. °· You have a fever. °SEEK IMMEDIATE MEDICAL CARE IF:  °Any of the following symptoms may represent a serious problem that is an emergency. Do not wait to see if the symptoms will go away. Get medical help right away. Call your local emergency services (911 in [place] Do not drive yourself to the hospital. °· You have sudden weakness or numbness of the face, arm, or leg, especially on one side of the body. °· You have sudden trouble walking or difficulty moving arms or legs. °· You have sudden confusion. °· You have trouble speaking (aphasia) or understanding. °· You have sudden trouble seeing in one or both eyes. °· You have a loss of balance or coordination. °· You have a sudden, severe headache with no known cause. °· You have new chest pain or an irregular heartbeat. °· You have a partial or total loss of consciousness. °MAKE SURE YOU:  °· Understand these instructions. °· Will watch your condition. °· Will get help right away if you are not doing well or get worse. °  °This information is not intended to replace advice given to you by your health care provider. Make sure you discuss any questions you have with your health care provider. °  °Document Released: 04/15/2005 Document Revised: 07/27/2014 Document Reviewed: 10/11/2013 °Elsevier Interactive Patient Education ©2016 [employer] ° °

## 2015-09-27 DIAGNOSIS — I509 Heart failure, unspecified: Secondary | ICD-10-CM | POA: Diagnosis not present

## 2015-09-27 DIAGNOSIS — E7800 Pure hypercholesterolemia, unspecified: Secondary | ICD-10-CM | POA: Diagnosis not present

## 2015-09-27 DIAGNOSIS — I110 Hypertensive heart disease with heart failure: Secondary | ICD-10-CM | POA: Diagnosis not present

## 2015-09-27 DIAGNOSIS — Z7982 Long term (current) use of aspirin: Secondary | ICD-10-CM | POA: Diagnosis not present

## 2015-09-27 DIAGNOSIS — I69854 Hemiplegia and hemiparesis following other cerebrovascular disease affecting left non-dominant side: Secondary | ICD-10-CM | POA: Diagnosis not present

## 2015-09-27 DIAGNOSIS — Z79899 Other long term (current) drug therapy: Secondary | ICD-10-CM | POA: Diagnosis not present

## 2015-09-27 DIAGNOSIS — Z23 Encounter for immunization: Secondary | ICD-10-CM | POA: Diagnosis not present

## 2015-09-27 DIAGNOSIS — Z7951 Long term (current) use of inhaled steroids: Secondary | ICD-10-CM | POA: Diagnosis not present

## 2015-09-27 DIAGNOSIS — G459 Transient cerebral ischemic attack, unspecified: Secondary | ICD-10-CM | POA: Diagnosis not present

## 2015-09-28 ENCOUNTER — Telehealth: Payer: Self-pay | Admitting: Family Medicine

## 2015-09-28 DIAGNOSIS — E7800 Pure hypercholesterolemia, unspecified: Secondary | ICD-10-CM | POA: Diagnosis not present

## 2015-09-28 DIAGNOSIS — I509 Heart failure, unspecified: Secondary | ICD-10-CM | POA: Diagnosis not present

## 2015-09-28 DIAGNOSIS — Z23 Encounter for immunization: Secondary | ICD-10-CM | POA: Diagnosis not present

## 2015-09-28 DIAGNOSIS — I69854 Hemiplegia and hemiparesis following other cerebrovascular disease affecting left non-dominant side: Secondary | ICD-10-CM | POA: Diagnosis not present

## 2015-09-28 DIAGNOSIS — I110 Hypertensive heart disease with heart failure: Secondary | ICD-10-CM | POA: Diagnosis not present

## 2015-09-28 DIAGNOSIS — Z79899 Other long term (current) drug therapy: Secondary | ICD-10-CM | POA: Diagnosis not present

## 2015-09-28 DIAGNOSIS — G459 Transient cerebral ischemic attack, unspecified: Secondary | ICD-10-CM | POA: Diagnosis not present

## 2015-09-28 DIAGNOSIS — Z7951 Long term (current) use of inhaled steroids: Secondary | ICD-10-CM | POA: Diagnosis not present

## 2015-09-28 DIAGNOSIS — Z7982 Long term (current) use of aspirin: Secondary | ICD-10-CM | POA: Diagnosis not present

## 2015-09-30 NOTE — Telephone Encounter (Signed)
[facility]

## 2015-10-01 DIAGNOSIS — Z7982 Long term (current) use of aspirin: Secondary | ICD-10-CM | POA: Diagnosis not present

## 2015-10-01 DIAGNOSIS — I509 Heart failure, unspecified: Secondary | ICD-10-CM | POA: Diagnosis not present

## 2015-10-01 DIAGNOSIS — I2510 Atherosclerotic heart disease of native coronary artery without angina pectoris: Secondary | ICD-10-CM | POA: Diagnosis not present

## 2015-10-01 DIAGNOSIS — I69354 Hemiplegia and hemiparesis following cerebral infarction affecting left non-dominant side: Secondary | ICD-10-CM | POA: Diagnosis not present

## 2015-10-01 DIAGNOSIS — I110 Hypertensive heart disease with heart failure: Secondary | ICD-10-CM | POA: Diagnosis not present

## 2015-10-02 DIAGNOSIS — G459 Transient cerebral ischemic attack, unspecified: Secondary | ICD-10-CM | POA: Diagnosis not present

## 2015-10-04 DIAGNOSIS — Z7982 Long term (current) use of aspirin: Secondary | ICD-10-CM | POA: Diagnosis not present

## 2015-10-04 DIAGNOSIS — I110 Hypertensive heart disease with heart failure: Secondary | ICD-10-CM | POA: Diagnosis not present

## 2015-10-04 DIAGNOSIS — I509 Heart failure, unspecified: Secondary | ICD-10-CM | POA: Diagnosis not present

## 2015-10-04 DIAGNOSIS — I69354 Hemiplegia and hemiparesis following cerebral infarction affecting left non-dominant side: Secondary | ICD-10-CM | POA: Diagnosis not present

## 2015-10-04 DIAGNOSIS — I2510 Atherosclerotic heart disease of native coronary artery without angina pectoris: Secondary | ICD-10-CM | POA: Diagnosis not present

## 2015-10-07 ENCOUNTER — Encounter: Payer: Self-pay | Admitting: Family Medicine

## 2015-10-07 ENCOUNTER — Ambulatory Visit: Payer: Medicare Other | Admitting: Family Medicine

## 2015-10-07 ENCOUNTER — Ambulatory Visit (INDEPENDENT_AMBULATORY_CARE_PROVIDER_SITE_OTHER): Payer: Medicare Other | Admitting: Family Medicine

## 2015-10-07 VITALS — BP 117/74 | HR 89 | Temp 99.6°F | Ht 63.5 in | Wt 215.0 lb

## 2015-10-07 DIAGNOSIS — M6289 Other specified disorders of muscle: Secondary | ICD-10-CM

## 2015-10-07 DIAGNOSIS — I2510 Atherosclerotic heart disease of native coronary artery without angina pectoris: Secondary | ICD-10-CM | POA: Diagnosis not present

## 2015-10-07 DIAGNOSIS — I110 Hypertensive heart disease with heart failure: Secondary | ICD-10-CM | POA: Diagnosis not present

## 2015-10-07 DIAGNOSIS — I878 Other specified disorders of veins: Secondary | ICD-10-CM | POA: Diagnosis not present

## 2015-10-07 DIAGNOSIS — R600 Localized edema: Secondary | ICD-10-CM

## 2015-10-07 DIAGNOSIS — I509 Heart failure, unspecified: Secondary | ICD-10-CM | POA: Diagnosis not present

## 2015-10-07 DIAGNOSIS — I69354 Hemiplegia and hemiparesis following cerebral infarction affecting left non-dominant side: Secondary | ICD-10-CM | POA: Diagnosis not present

## 2015-10-07 DIAGNOSIS — Z7982 Long term (current) use of aspirin: Secondary | ICD-10-CM | POA: Diagnosis not present

## 2015-10-07 DIAGNOSIS — R531 Weakness: Secondary | ICD-10-CM

## 2015-10-07 NOTE — Progress Notes (Signed)
Subjective:    Patient ID: Betty Keller, female    DOB: 11-13-1935, 80 y.o.   MRN: [id number]  HPI Patient here today for hospital follow up. She was admitted to [facility] around 09/23/15. Her diagnosis was Stroke, pneumonia, sinus and ear infection. Apparently, CVA affected left leg only. She thinks there is decreased vision in her left eye but that would not be consistent with a stroke that affected her left leg. She is receiving home physical therapy 2 days per week. She was also found to have sinus infection and pneumonia. That was treated with parenteral antibiotics in the hospital and she is now finishing a 10 day course of amoxicillin. Hospitalization was from Thursday to Sunday last week.    Patient Active Problem List   Diagnosis Date Noted  . Hypothyroid 09/18/2013  . Bronchitis, chronic obstructive w acute bronchitis (HCC) 06/29/2013  . COPD (chronic obstructive pulmonary disease) (HCC) 06/27/2013  . Chest congestion 06/27/2013  . Thyroid disease   . Acute upper respiratory infections of unspecified site 03/28/2013  . Hyperlipidemia   . Unspecified vitamin D deficiency 01/24/2013  . Cerumen impaction 11/15/2012  . Chronic venous insufficiency 11/15/2012  . Pedal edema 11/15/2012  . GERD (gastroesophageal reflux disease) 10/13/2010  . HTN (hypertension) 10/13/2010  . IBS (irritable bowel syndrome) 10/13/2010  . Osteoarthritis of shoulder 10/13/2010  . Obesity 10/13/2010  . Rectocele 10/13/2010  . Insomnia 10/13/2010  . Bundle branch block, left 10/13/2010  . Asthma 10/13/2010   Outpatient Encounter Prescriptions as of 10/07/2015  Medication Sig  . budesonide-formoterol (SYMBICORT) 160-4.5 MCG/ACT inhaler Inhale 2 puffs into the lungs 2 (two) times daily.  . Cholecalciferol (VITAMIN D) 2000 UNITS CAPS Take 4,000 Units by mouth daily. One daily  . Flaxseed, Linseed, (FLAX SEED OIL PO) Take 1 capsule by mouth daily.  . furosemide (LASIX) 40 MG tablet Take 1 tablet  (40 mg total) by mouth daily.  Marland Kitchen. lisinopril-hydrochlorothiazide (PRINZIDE,ZESTORETIC) 10-12.5 MG per tablet Take 1 tablet by mouth daily.  . metolazone (ZAROXOLYN) 2.5 MG tablet Take one tablet on Monday, Wednesday and Friday  . Multiple Vitamins-Minerals (ICAPS AREDS 2 PO) Take by mouth.  . mupirocin ointment (BACTROBAN) 2 % Apply 1 application topically 3 (three) times daily.  . nortriptyline (PAMELOR) 75 MG capsule TAKE ONE CAPSULE BY MOUTH AT BEDTIME  . ranitidine (ZANTAC) 150 MG tablet Take 1 tablet (150 mg total) by mouth 2 (two) times daily.  . simvastatin (ZOCOR) 40 MG tablet TAKE ONE TABLET BY MOUTH ONCE DAILY AT BEDTIME  . triamcinolone cream (KENALOG) 0.1 % Apply topically 3 (three) times daily.  . [DISCONTINUED] amoxicillin (AMOXIL) 875 MG tablet Take 1 tablet (875 mg total) by mouth 2 (two) times daily. 1 po BID   No facility-administered encounter medications on file as of 10/07/2015.      Review of Systems  HENT: Positive for sinus pressure.   Eyes: Negative.   Respiratory: Negative.   Cardiovascular: Negative.   Gastrointestinal: Negative.   Endocrine: Negative.   Genitourinary: Negative.   Musculoskeletal: Negative.   Skin: Negative.   Allergic/Immunologic: Negative.   Neurological: Positive for weakness (of left leg from stroke ).  Hematological: Negative.   Psychiatric/Behavioral: Negative.        Objective:   Physical Exam  Constitutional: She is oriented to person, place, and time. She appears well-developed and well-nourished.  HENT:  Right Ear: External ear normal.  Left Ear: External ear normal.  Tympanic membranes are dull  Cardiovascular: Normal  rate and regular rhythm.   Pulmonary/Chest: Effort normal and breath sounds normal.  Neurological: She is alert and oriented to person, place, and time. She displays normal reflexes. No cranial nerve deficit. She exhibits abnormal muscle tone. Coordination abnormal.  Psychiatric: She has a normal mood and  affect.   BP 117/74 mmHg  Pulse 89  Temp(Src) 99.6 F (37.6 C) (Oral)  Ht 5' 3.5" (1.613 m)  Wt 215 lb (97.523 kg)  BMI 37.48 kg/m2        Assessment & Plan:  1. Left-sided weakness I would estimate strength at 3-4/5. She does ambulate fairly well with a walker. She has no steps to navigate inside her house  2. Pedal edema IMA has improved on Lasix as has her stasis dermatitis  3. Venous stasis of lower extremity  improved on Lasix and steroid cream  Frederica Kuster MD

## 2015-10-08 ENCOUNTER — Telehealth: Payer: Self-pay | Admitting: Family Medicine

## 2015-10-08 NOTE — Telephone Encounter (Signed)
error 

## 2015-10-09 DIAGNOSIS — Z7982 Long term (current) use of aspirin: Secondary | ICD-10-CM | POA: Diagnosis not present

## 2015-10-09 DIAGNOSIS — I110 Hypertensive heart disease with heart failure: Secondary | ICD-10-CM | POA: Diagnosis not present

## 2015-10-09 DIAGNOSIS — I2510 Atherosclerotic heart disease of native coronary artery without angina pectoris: Secondary | ICD-10-CM | POA: Diagnosis not present

## 2015-10-09 DIAGNOSIS — I69354 Hemiplegia and hemiparesis following cerebral infarction affecting left non-dominant side: Secondary | ICD-10-CM | POA: Diagnosis not present

## 2015-10-09 DIAGNOSIS — I509 Heart failure, unspecified: Secondary | ICD-10-CM | POA: Diagnosis not present

## 2015-10-16 DIAGNOSIS — I69354 Hemiplegia and hemiparesis following cerebral infarction affecting left non-dominant side: Secondary | ICD-10-CM | POA: Diagnosis not present

## 2015-10-16 DIAGNOSIS — I509 Heart failure, unspecified: Secondary | ICD-10-CM | POA: Diagnosis not present

## 2015-10-16 DIAGNOSIS — I2510 Atherosclerotic heart disease of native coronary artery without angina pectoris: Secondary | ICD-10-CM | POA: Diagnosis not present

## 2015-10-16 DIAGNOSIS — Z7982 Long term (current) use of aspirin: Secondary | ICD-10-CM | POA: Diagnosis not present

## 2015-10-16 DIAGNOSIS — I110 Hypertensive heart disease with heart failure: Secondary | ICD-10-CM | POA: Diagnosis not present

## 2015-10-30 DIAGNOSIS — H353132 Nonexudative age-related macular degeneration, bilateral, intermediate dry stage: Secondary | ICD-10-CM | POA: Diagnosis not present

## 2015-10-30 DIAGNOSIS — Z961 Presence of intraocular lens: Secondary | ICD-10-CM | POA: Diagnosis not present

## 2015-10-30 DIAGNOSIS — H04123 Dry eye syndrome of bilateral lacrimal glands: Secondary | ICD-10-CM | POA: Diagnosis not present

## 2015-11-05 ENCOUNTER — Ambulatory Visit (INDEPENDENT_AMBULATORY_CARE_PROVIDER_SITE_OTHER): Payer: Medicare Other

## 2015-11-05 ENCOUNTER — Encounter: Payer: Self-pay | Admitting: Family Medicine

## 2015-11-05 ENCOUNTER — Ambulatory Visit (INDEPENDENT_AMBULATORY_CARE_PROVIDER_SITE_OTHER): Payer: Medicare Other | Admitting: Family Medicine

## 2015-11-05 VITALS — BP 122/71 | HR 72 | Temp 97.6°F | Ht 63.5 in | Wt 215.0 lb

## 2015-11-05 DIAGNOSIS — S81802A Unspecified open wound, left lower leg, initial encounter: Secondary | ICD-10-CM

## 2015-11-05 DIAGNOSIS — M79662 Pain in left lower leg: Secondary | ICD-10-CM

## 2015-11-05 NOTE — Progress Notes (Signed)
Subjective:    Patient ID: Betty Keller, female    DOB: 12-Oct-1935, 80 y.o.   MRN: [id number]  HPI Patient here today for a recent fall on Friday. She hit her left lower leg.There is pain with weightbearing. She has a L-shaped flap on the anterior lower leg on the left. Continues with significant edema in both legs with stasis dermatitis.      Patient Active Problem List   Diagnosis Date Noted  . Hypothyroid 09/18/2013  . Bronchitis, chronic obstructive w acute bronchitis (HCC) 06/29/2013  . COPD (chronic obstructive pulmonary disease) (HCC) 06/27/2013  . Chest congestion 06/27/2013  . Thyroid disease   . Acute upper respiratory infections of unspecified site 03/28/2013  . Hyperlipidemia   . Unspecified vitamin D deficiency 01/24/2013  . Cerumen impaction 11/15/2012  . Chronic venous insufficiency 11/15/2012  . Pedal edema 11/15/2012  . GERD (gastroesophageal reflux disease) 10/13/2010  . HTN (hypertension) 10/13/2010  . IBS (irritable bowel syndrome) 10/13/2010  . Osteoarthritis of shoulder 10/13/2010  . Obesity 10/13/2010  . Rectocele 10/13/2010  . Insomnia 10/13/2010  . Bundle branch block, left 10/13/2010  . Asthma 10/13/2010   Outpatient Encounter Prescriptions as of 11/05/2015  Medication Sig  . budesonide-formoterol (SYMBICORT) 160-4.5 MCG/ACT inhaler Inhale 2 puffs into the lungs 2 (two) times daily.  . Cholecalciferol (VITAMIN D) 2000 UNITS CAPS Take 4,000 Units by mouth daily. One daily  . Flaxseed, Linseed, (FLAX SEED OIL PO) Take 1 capsule by mouth daily.  . furosemide (LASIX) 40 MG tablet Take 1 tablet (40 mg total) by mouth daily.  Marland Kitchen lisinopril-hydrochlorothiazide (PRINZIDE,ZESTORETIC) 10-12.5 MG per tablet Take 1 tablet by mouth daily.  . metolazone (ZAROXOLYN) 2.5 MG tablet Take one tablet on Monday, Wednesday and Friday  . Multiple Vitamins-Minerals (ICAPS AREDS 2 PO) Take by mouth.  . mupirocin ointment (BACTROBAN) 2 % Apply 1 application topically 3  (three) times daily.  . nortriptyline (PAMELOR) 75 MG capsule TAKE ONE CAPSULE BY MOUTH AT BEDTIME  . ranitidine (ZANTAC) 150 MG tablet Take 1 tablet (150 mg total) by mouth 2 (two) times daily.  . simvastatin (ZOCOR) 40 MG tablet TAKE ONE TABLET BY MOUTH ONCE DAILY AT BEDTIME  . triamcinolone cream (KENALOG) 0.1 % Apply topically 3 (three) times daily.   No facility-administered encounter medications on file as of 11/05/2015.     Review of Systems  Constitutional: Negative.   HENT: Negative.   Eyes: Negative.   Respiratory: Negative.   Cardiovascular: Negative.   Gastrointestinal: Negative.   Endocrine: Negative.   Genitourinary: Negative.   Musculoskeletal: Negative.   Skin: Positive for wound (left lower leg).       Bilateral lower legs - red and swelling  Allergic/Immunologic: Negative.   Neurological: Negative.   Hematological: Negative.   Psychiatric/Behavioral: Negative.        Objective:   Physical Exam  Constitutional: She appears well-developed and well-nourished.  Skin:  Flap looks like full-thickness think she could need a graft will treat her conservatively for now and set her up an appointment with the wound center in [place]. X-ray is pending    BP 122/71 mmHg  Pulse 72  Temp(Src) 97.6 F (36.4 C) (Oral)  Ht 5' 3.5" (1.613 m)  Wt 215 lb (97.523 kg)  BMI 37.48 kg/m2       Assessment & Plan:  1. Pain in left lower leg X-ray shows no injury to the bone. - DG Tibia/Fibula Left; Future - Ambulatory referral to Pain Clinic  2. Wound of left lower extremity, initial encounter May need skin graft. Treat conservatively for now with the wrapping wet to dry dressing - Ambulatory referral to Pain Clinic  Frederica KusterStephen M Miller MD

## 2015-11-06 ENCOUNTER — Telehealth: Payer: Self-pay | Admitting: Family Medicine

## 2015-11-13 ENCOUNTER — Encounter (HOSPITAL_BASED_OUTPATIENT_CLINIC_OR_DEPARTMENT_OTHER): Payer: Medicare Other | Attending: Surgery

## 2015-11-13 DIAGNOSIS — S81812A Laceration without foreign body, left lower leg, initial encounter: Secondary | ICD-10-CM | POA: Insufficient documentation

## 2015-11-13 DIAGNOSIS — Z87891 Personal history of nicotine dependence: Secondary | ICD-10-CM | POA: Insufficient documentation

## 2015-11-13 DIAGNOSIS — I10 Essential (primary) hypertension: Secondary | ICD-10-CM | POA: Insufficient documentation

## 2015-11-13 DIAGNOSIS — J45909 Unspecified asthma, uncomplicated: Secondary | ICD-10-CM | POA: Insufficient documentation

## 2015-11-13 DIAGNOSIS — W19XXXA Unspecified fall, initial encounter: Secondary | ICD-10-CM | POA: Diagnosis not present

## 2015-11-13 DIAGNOSIS — E669 Obesity, unspecified: Secondary | ICD-10-CM | POA: Diagnosis not present

## 2015-11-13 DIAGNOSIS — M170 Bilateral primary osteoarthritis of knee: Secondary | ICD-10-CM | POA: Insufficient documentation

## 2015-11-13 DIAGNOSIS — Z7951 Long term (current) use of inhaled steroids: Secondary | ICD-10-CM | POA: Insufficient documentation

## 2015-11-13 DIAGNOSIS — Z6834 Body mass index (BMI) 34.0-34.9, adult: Secondary | ICD-10-CM | POA: Insufficient documentation

## 2015-11-13 DIAGNOSIS — I890 Lymphedema, not elsewhere classified: Secondary | ICD-10-CM | POA: Diagnosis not present

## 2015-11-13 DIAGNOSIS — Z79899 Other long term (current) drug therapy: Secondary | ICD-10-CM | POA: Insufficient documentation

## 2015-11-13 DIAGNOSIS — J449 Chronic obstructive pulmonary disease, unspecified: Secondary | ICD-10-CM | POA: Insufficient documentation

## 2015-11-13 DIAGNOSIS — Z8673 Personal history of transient ischemic attack (TIA), and cerebral infarction without residual deficits: Secondary | ICD-10-CM | POA: Diagnosis not present

## 2015-11-14 ENCOUNTER — Ambulatory Visit: Payer: Medicare Other | Admitting: Family Medicine

## 2015-11-19 ENCOUNTER — Other Ambulatory Visit: Payer: Self-pay | Admitting: Surgery

## 2015-11-19 DIAGNOSIS — L97929 Non-pressure chronic ulcer of unspecified part of left lower leg with unspecified severity: Secondary | ICD-10-CM

## 2015-11-20 ENCOUNTER — Ambulatory Visit (HOSPITAL_COMMUNITY)
Admission: RE | Admit: 2015-11-20 | Discharge: 2015-11-20 | Disposition: A | Discharge status: Home / Self Care | Payer: Medicare Other | Source: Ambulatory Visit | Attending: Vascular Surgery | Admitting: Vascular Surgery

## 2015-11-20 ENCOUNTER — Encounter (HOSPITAL_BASED_OUTPATIENT_CLINIC_OR_DEPARTMENT_OTHER): Payer: Medicare Other | Attending: Surgery

## 2015-11-20 DIAGNOSIS — E785 Hyperlipidemia, unspecified: Secondary | ICD-10-CM | POA: Insufficient documentation

## 2015-11-20 DIAGNOSIS — I83028 Varicose veins of left lower extremity with ulcer other part of lower leg: Secondary | ICD-10-CM | POA: Diagnosis not present

## 2015-11-20 DIAGNOSIS — E669 Obesity, unspecified: Secondary | ICD-10-CM | POA: Insufficient documentation

## 2015-11-20 DIAGNOSIS — S81812A Laceration without foreign body, left lower leg, initial encounter: Secondary | ICD-10-CM | POA: Diagnosis not present

## 2015-11-20 DIAGNOSIS — K219 Gastro-esophageal reflux disease without esophagitis: Secondary | ICD-10-CM | POA: Diagnosis not present

## 2015-11-20 DIAGNOSIS — Z6834 Body mass index (BMI) 34.0-34.9, adult: Secondary | ICD-10-CM | POA: Diagnosis not present

## 2015-11-20 DIAGNOSIS — I890 Lymphedema, not elsewhere classified: Secondary | ICD-10-CM | POA: Diagnosis not present

## 2015-11-20 DIAGNOSIS — I10 Essential (primary) hypertension: Secondary | ICD-10-CM | POA: Diagnosis not present

## 2015-11-20 DIAGNOSIS — I8393 Asymptomatic varicose veins of bilateral lower extremities: Secondary | ICD-10-CM | POA: Diagnosis not present

## 2015-11-20 DIAGNOSIS — L97821 Non-pressure chronic ulcer of other part of left lower leg limited to breakdown of skin: Secondary | ICD-10-CM | POA: Insufficient documentation

## 2015-11-20 DIAGNOSIS — L97929 Non-pressure chronic ulcer of unspecified part of left lower leg with unspecified severity: Secondary | ICD-10-CM | POA: Insufficient documentation

## 2015-11-20 DIAGNOSIS — J449 Chronic obstructive pulmonary disease, unspecified: Secondary | ICD-10-CM | POA: Diagnosis not present

## 2015-11-20 DIAGNOSIS — Z79899 Other long term (current) drug therapy: Secondary | ICD-10-CM | POA: Diagnosis not present

## 2015-11-20 DIAGNOSIS — E039 Hypothyroidism, unspecified: Secondary | ICD-10-CM | POA: Diagnosis not present

## 2015-11-26 DIAGNOSIS — I83028 Varicose veins of left lower extremity with ulcer other part of lower leg: Secondary | ICD-10-CM | POA: Diagnosis not present

## 2015-11-26 DIAGNOSIS — I10 Essential (primary) hypertension: Secondary | ICD-10-CM | POA: Diagnosis not present

## 2015-11-26 DIAGNOSIS — I890 Lymphedema, not elsewhere classified: Secondary | ICD-10-CM | POA: Diagnosis not present

## 2015-11-26 DIAGNOSIS — J449 Chronic obstructive pulmonary disease, unspecified: Secondary | ICD-10-CM | POA: Diagnosis not present

## 2015-11-26 DIAGNOSIS — L97821 Non-pressure chronic ulcer of other part of left lower leg limited to breakdown of skin: Secondary | ICD-10-CM | POA: Diagnosis not present

## 2015-11-26 DIAGNOSIS — E039 Hypothyroidism, unspecified: Secondary | ICD-10-CM | POA: Diagnosis not present

## 2015-11-26 DIAGNOSIS — E785 Hyperlipidemia, unspecified: Secondary | ICD-10-CM | POA: Diagnosis not present

## 2015-11-26 DIAGNOSIS — S81812A Laceration without foreign body, left lower leg, initial encounter: Secondary | ICD-10-CM | POA: Diagnosis not present

## 2015-11-26 DIAGNOSIS — Z79899 Other long term (current) drug therapy: Secondary | ICD-10-CM | POA: Diagnosis not present

## 2015-11-27 ENCOUNTER — Encounter: Payer: Self-pay | Admitting: Vascular Surgery

## 2015-11-29 ENCOUNTER — Encounter: Payer: Self-pay | Admitting: Vascular Surgery

## 2015-11-29 ENCOUNTER — Ambulatory Visit (INDEPENDENT_AMBULATORY_CARE_PROVIDER_SITE_OTHER): Payer: Medicare Other | Admitting: Vascular Surgery

## 2015-11-29 VITALS — BP 132/71 | HR 72 | Temp 98.8°F | Resp 16 | Ht 64.0 in | Wt 200.0 lb

## 2015-11-29 DIAGNOSIS — I872 Venous insufficiency (chronic) (peripheral): Secondary | ICD-10-CM | POA: Diagnosis not present

## 2015-11-29 MED ORDER — CEPHALEXIN 500 MG PO CAPS: 500.0000 mg | ORAL_CAPSULE | Freq: Three times a day (TID) | ORAL | Status: DC

## 2015-11-29 NOTE — Progress Notes (Signed)
[facility] [place]  Patient name: Betty Keller MRN: [id number] DOB: May 08, 1936 Sex: female  REASON FOR CONSULT: Chronic venous insufficiency. Referred by Dr. Evlyn Kanner.  HPI: Betty Keller is a 80 y.o. female, who is referred by the wound care center. She injured her left leg and developed an anterior leg wound. She has a history of chronic venous insufficiency. This occurred about a month ago and she is being followed in the wound care center. She is getting local wound care and compression therapy. She had an abnormal venous duplex study was sent for vascular consultation.  She has had a long history of bilateral lower extremity swelling. In the past the swelling has been about the same in each leg. She expenses aching pain and heaviness in her legs with standing which is relieved somewhat with elevation. She is unaware of any family history of clotting disorders. She denies any previous history of DVT herself.  Past Medical History  Diagnosis Date  . GERD (gastroesophageal reflux disease)   . Esophagitis   . Hypertension   . IBS (irritable bowel syndrome)   . Osteoarthritis     shoulders   . Paresis (HCC)     rt. pelvis   . History of obesity     lost 100 lbs  . Hypotonic bladder   . Rectocele     distal ; with outlet obstruction   . Insomnia   . Asthma   . Premature ventricular contraction   . BBB (bundle branch block)     left   . Osteopenia   . Hemidiaphragm paralysis   . Vitamin D deficiency   . Hyperlipidemia   . Thyroid disease     hypothyroidism  . Fibrocystic breast     Family History  Problem Relation Age of Onset  . Heart disease Mother     Unspecified.  Died age 33  . Breast cancer Mother   . Heart attack Father     Enlarged heart.    . Hyperlipidemia Brother   . Heart disease Brother   . Breast cancer      Family History   . Asthma Son     SOCIAL HISTORY: Social History   Social History  . Marital Status: Single      Spouse Name: N/A  . Number of Children: N/A  . Years of Education: N/A   Occupational History  . Not on file.   Social History Main Topics  . Smoking status: Former Smoker    Start date: 04/05/1972    Quit date: 07/20/1981  . Smokeless tobacco: Never Used  . Alcohol Use: No  . Drug Use: No  . Sexual Activity: No   Other Topics Concern  . Not on file   Social History Narrative   Lives with husband.   One child.      Allergies  Allergen Reactions  . Alprazolam   . Buspar [Buspirone Hcl]   . Lorazepam     Current Outpatient Prescriptions  Medication Sig Dispense Refill  . budesonide-formoterol (SYMBICORT) 160-4.5 MCG/ACT inhaler Inhale 2 puffs into the lungs 2 (two) times daily. 1 Inhaler 2  . Cholecalciferol (VITAMIN D) 2000 UNITS CAPS Take 4,000 Units by mouth daily. One daily    . Flaxseed, Linseed, (FLAX SEED OIL PO) Take 1 capsule by mouth daily.    . furosemide (LASIX) 40 MG tablet Take 1 tablet (40 mg total) by mouth daily. 90 tablet 1  . lisinopril-hydrochlorothiazide (PRINZIDE,ZESTORETIC)  10-12.5 MG per tablet Take 1 tablet by mouth daily. 90 tablet 1  . metolazone (ZAROXOLYN) 2.5 MG tablet Take one tablet on Monday, Wednesday and Friday 15 tablet 3  . Multiple Vitamins-Minerals (ICAPS AREDS 2 PO) Take by mouth.    . nortriptyline (PAMELOR) 75 MG capsule TAKE ONE CAPSULE BY MOUTH AT BEDTIME 90 capsule 0  . ranitidine (ZANTAC) 150 MG tablet Take 1 tablet (150 mg total) by mouth 2 (two) times daily. 180 tablet 1  . simvastatin (ZOCOR) 40 MG tablet TAKE ONE TABLET BY MOUTH ONCE DAILY AT BEDTIME 90 tablet 0  . triamcinolone cream (KENALOG) 0.1 % Apply topically 3 (three) times daily. 45 g 1  . cephALEXin (KEFLEX) 500 MG capsule Take 1 capsule (500 mg total) by mouth 3 (three) times daily. 42 capsule 3  . mupirocin ointment (BACTROBAN) 2 % Apply 1 application topically 3 (three) times daily. (Patient not taking: Reported on 11/29/2015) 22 g 6   No current  facility-administered medications for this visit.    REVIEW OF SYSTEMS:  [X]  denotes positive finding, [ ]  denotes negative finding Cardiac  Comments:  Chest pain or chest pressure:    Shortness of breath upon exertion: X   Short of breath when lying flat:    Irregular heart rhythm:        Vascular    Pain in calf, thigh, or hip brought on by ambulation:    Pain in feet at night that wakes you up from your sleep:     Blood clot in your veins:    Leg swelling:  X       Pulmonary    Oxygen at home:    Productive cough:     Wheezing:         Neurologic    Sudden weakness in arms or legs:     Sudden numbness in arms or legs:     Sudden onset of difficulty speaking or slurred speech:    Temporary loss of vision in one eye:     Problems with dizziness:         Gastrointestinal    Blood in stool:     Vomited blood:         Genitourinary    Burning when urinating:     Blood in urine:        Psychiatric    Major depression:         Hematologic    Bleeding problems:    Problems with blood clotting too easily:        Skin    Rashes or ulcers:        Constitutional    Fever or chills:      PHYSICAL EXAM: Filed Vitals:   11/29/15 1020  BP: 132/71  Pulse: 72  Temp: 98.8 F (37.1 C)  Resp: 16  Height: 5\' 4"  (1.626 m)  Weight: 200 lb (90.719 kg)  SpO2: 92%    GENERAL: The patient is a well-nourished female, in no acute distress. The vital signs are documented above. CARDIAC: There is a regular rate and rhythm.  VASCULAR: I do not detect carotid bruits. She has significant bilateral lower extremity swelling which is worse on the right side. Because of her swelling and obesity, I'm unable to palpate any pulses. However, she does have biphasic Doppler signals in the dorsalis pedis and posterior tibial positions bilaterally. PULMONARY: There is good air exchange bilaterally without wheezing or rales. ABDOMEN: Soft and non-tender with normal pitched  bowel sounds.    MUSCULOSKELETAL: There are no major deformities or cyanosis. NEUROLOGIC: No focal weakness or paresthesias are detected. SKIN: she has a superficial ulceration on her left anterior leg. There are no ulcers on the right side. She does have some hyperpigmentation but also has significant cellulitis in both legs. PSYCHIATRIC: The patient has a normal affect.  DATA:   LOWER EXTREMITY VENOUS DUPLEX: I have independently interpreted her lower extremity venous duplex scan that was done on 11/20/2015.  On the right side, there is no evidence of DVT or superficial thrombophlebitis. There is reflux in the deep system on the right involving the common femoral vein. There is also reflux in the right saphenofemoral junction and right great saphenous vein. There is no reflux in the small saphenous vein.  On the left side, there is no evidence of DVT or superficial thrombophlebitis. There is reflux on the left involving the common femoral vein. There is also reflux at the saphenofemoral junction. There is no reflux in the great saphenous vein on the left or the small saphenous vein.  MEDICAL ISSUES:  CHRONIC VENOUS INSUFFICIENCY/ VENOUS STASIS ULCER RIGHT LEG: this patient has a venous stasis ulcer of the left leg which is being treated at the wound care center with aggressive wound care and compression therapy. I encouraged her to continue her follow up for her wound there as they do an excellent job with this. I have explained that she does not have evidence of significant arterial insufficiency given that she has biphasic Doppler signals in both feet. Therefore, she should tolerate elevation and compression therapy. The limiting factor with her elevation is that she does become short of breath when lying flat. Once her wound has healed October [place] lands on fitting her in compression stockings. She'll continue to elevate her legs. I'm encouraged her to ambulate as much as possible and to avoid prolonged  sitting and standing. Once the wound heals I think she would be a good candidate for water aerobics which is also helpful for patients with venous disease. Out be happy to see her back at any time if any vascular issues arise.    Waverly Ferrari [facility] [place] Beeper: [phone]

## 2015-12-02 DIAGNOSIS — S81802A Unspecified open wound, left lower leg, initial encounter: Secondary | ICD-10-CM | POA: Diagnosis not present

## 2015-12-03 DIAGNOSIS — I87312 Chronic venous hypertension (idiopathic) with ulcer of left lower extremity: Secondary | ICD-10-CM | POA: Diagnosis not present

## 2015-12-03 DIAGNOSIS — I83028 Varicose veins of left lower extremity with ulcer other part of lower leg: Secondary | ICD-10-CM | POA: Diagnosis not present

## 2015-12-03 DIAGNOSIS — E785 Hyperlipidemia, unspecified: Secondary | ICD-10-CM | POA: Diagnosis not present

## 2015-12-03 DIAGNOSIS — L97821 Non-pressure chronic ulcer of other part of left lower leg limited to breakdown of skin: Secondary | ICD-10-CM | POA: Diagnosis not present

## 2015-12-03 DIAGNOSIS — I10 Essential (primary) hypertension: Secondary | ICD-10-CM | POA: Diagnosis not present

## 2015-12-03 DIAGNOSIS — E039 Hypothyroidism, unspecified: Secondary | ICD-10-CM | POA: Diagnosis not present

## 2015-12-03 DIAGNOSIS — I890 Lymphedema, not elsewhere classified: Secondary | ICD-10-CM | POA: Diagnosis not present

## 2015-12-03 DIAGNOSIS — Z79899 Other long term (current) drug therapy: Secondary | ICD-10-CM | POA: Diagnosis not present

## 2015-12-03 DIAGNOSIS — J449 Chronic obstructive pulmonary disease, unspecified: Secondary | ICD-10-CM | POA: Diagnosis not present

## 2015-12-03 DIAGNOSIS — S81812A Laceration without foreign body, left lower leg, initial encounter: Secondary | ICD-10-CM | POA: Diagnosis not present

## 2015-12-10 DIAGNOSIS — E785 Hyperlipidemia, unspecified: Secondary | ICD-10-CM | POA: Diagnosis not present

## 2015-12-10 DIAGNOSIS — I10 Essential (primary) hypertension: Secondary | ICD-10-CM | POA: Diagnosis not present

## 2015-12-10 DIAGNOSIS — I87312 Chronic venous hypertension (idiopathic) with ulcer of left lower extremity: Secondary | ICD-10-CM | POA: Diagnosis not present

## 2015-12-10 DIAGNOSIS — E039 Hypothyroidism, unspecified: Secondary | ICD-10-CM | POA: Diagnosis not present

## 2015-12-10 DIAGNOSIS — L97821 Non-pressure chronic ulcer of other part of left lower leg limited to breakdown of skin: Secondary | ICD-10-CM | POA: Diagnosis not present

## 2015-12-10 DIAGNOSIS — Z79899 Other long term (current) drug therapy: Secondary | ICD-10-CM | POA: Diagnosis not present

## 2015-12-10 DIAGNOSIS — S81812D Laceration without foreign body, left lower leg, subsequent encounter: Secondary | ICD-10-CM | POA: Diagnosis not present

## 2015-12-10 DIAGNOSIS — I890 Lymphedema, not elsewhere classified: Secondary | ICD-10-CM | POA: Diagnosis not present

## 2015-12-10 DIAGNOSIS — J449 Chronic obstructive pulmonary disease, unspecified: Secondary | ICD-10-CM | POA: Diagnosis not present

## 2015-12-10 DIAGNOSIS — I83028 Varicose veins of left lower extremity with ulcer other part of lower leg: Secondary | ICD-10-CM | POA: Diagnosis not present

## 2015-12-11 ENCOUNTER — Encounter (INDEPENDENT_AMBULATORY_CARE_PROVIDER_SITE_OTHER): Payer: Self-pay

## 2015-12-12 ENCOUNTER — Ambulatory Visit (INDEPENDENT_AMBULATORY_CARE_PROVIDER_SITE_OTHER): Payer: Medicare Other | Admitting: Family Medicine

## 2015-12-12 ENCOUNTER — Encounter: Payer: Self-pay | Admitting: Family Medicine

## 2015-12-12 VITALS — BP 122/69 | HR 80 | Temp 97.3°F | Ht 64.0 in | Wt 214.6 lb

## 2015-12-12 DIAGNOSIS — I10 Essential (primary) hypertension: Secondary | ICD-10-CM

## 2015-12-12 DIAGNOSIS — J449 Chronic obstructive pulmonary disease, unspecified: Secondary | ICD-10-CM | POA: Diagnosis not present

## 2015-12-12 MED ORDER — NORTRIPTYLINE HCL 75 MG PO CAPS: 75.0000 mg | ORAL_CAPSULE | Freq: Every day | ORAL | Status: DC

## 2015-12-12 MED ORDER — SIMVASTATIN 40 MG PO TABS: 40.0000 mg | ORAL_TABLET | Freq: Every day | ORAL | Status: DC

## 2015-12-12 NOTE — Progress Notes (Signed)
Subjective:    Patient ID: Betty Keller, female    DOB: Jul 01, 1936, 80 y.o.   MRN: [id number]  HPI 80 year old female who is here to follow-up her chronic venous insufficiency leg ulcer, hypertension, COPD. She is now being followed at the wound center in [place] and the wound has almost healed by history. She also saw the vascular surgeon recently but no intervention was required there. She says that her breathing is doing okay as regards her COPD. She had blood work done 5 months ago so I do not think we need to repeat that today  Patient Active Problem List   Diagnosis Date Noted  . Hypothyroid 09/18/2013  . Bronchitis, chronic obstructive w acute bronchitis (HCC) 06/29/2013  . COPD (chronic obstructive pulmonary disease) (HCC) 06/27/2013  . Chest congestion 06/27/2013  . Thyroid disease   . Acute upper respiratory infections of unspecified site 03/28/2013  . Hyperlipidemia   . Unspecified vitamin D deficiency 01/24/2013  . Cerumen impaction 11/15/2012  . Chronic venous insufficiency 11/15/2012  . Pedal edema 11/15/2012  . GERD (gastroesophageal reflux disease) 10/13/2010  . HTN (hypertension) 10/13/2010  . IBS (irritable bowel syndrome) 10/13/2010  . Osteoarthritis of shoulder 10/13/2010  . Obesity 10/13/2010  . Rectocele 10/13/2010  . Insomnia 10/13/2010  . Bundle branch block, left 10/13/2010  . Asthma 10/13/2010   Outpatient Encounter Prescriptions as of 12/12/2015  Medication Sig  . budesonide-formoterol (SYMBICORT) 160-4.5 MCG/ACT inhaler Inhale 2 puffs into the lungs 2 (two) times daily.  . cephALEXin (KEFLEX) 500 MG capsule Take 1 capsule (500 mg total) by mouth 3 (three) times daily.  . Cholecalciferol (VITAMIN D) 2000 UNITS CAPS Take 4,000 Units by mouth daily. One daily  . Flaxseed, Linseed, (FLAX SEED OIL PO) Take 1 capsule by mouth daily.  . furosemide (LASIX) 40 MG tablet Take 1 tablet (40 mg total) by mouth daily.  Marland Kitchen lisinopril-hydrochlorothiazide  (PRINZIDE,ZESTORETIC) 10-12.5 MG per tablet Take 1 tablet by mouth daily.  . metolazone (ZAROXOLYN) 2.5 MG tablet Take one tablet on Monday, Wednesday and Friday  . Multiple Vitamins-Minerals (ICAPS AREDS 2 PO) Take by mouth.  . mupirocin ointment (BACTROBAN) 2 % Apply 1 application topically 3 (three) times daily.  . nortriptyline (PAMELOR) 75 MG capsule Take 1 capsule (75 mg total) by mouth at bedtime.  . ranitidine (ZANTAC) 150 MG tablet Take 1 tablet (150 mg total) by mouth 2 (two) times daily.  . simvastatin (ZOCOR) 40 MG tablet Take 1 tablet (40 mg total) by mouth daily with breakfast.  . triamcinolone cream (KENALOG) 0.1 % Apply topically 3 (three) times daily.  . [DISCONTINUED] nortriptyline (PAMELOR) 75 MG capsule TAKE ONE CAPSULE BY MOUTH AT BEDTIME  . [DISCONTINUED] simvastatin (ZOCOR) 40 MG tablet TAKE ONE TABLET BY MOUTH ONCE DAILY AT BEDTIME   No facility-administered encounter medications on file as of 12/12/2015.      Review of Systems  HENT: Negative.   Respiratory: Negative.   Cardiovascular: Negative.   Gastrointestinal: Negative.   Genitourinary: Negative.   Neurological: Positive for weakness.  Psychiatric/Behavioral: Negative.        Objective:   Physical Exam  Constitutional: She is oriented to person, place, and time. She appears well-developed and well-nourished.  HENT:  Right Ear: External ear normal.  Left Ear: External ear normal.  Cardiovascular: Normal rate and regular rhythm.   Pulmonary/Chest: Effort normal and breath sounds normal.  Musculoskeletal: Edema: there is trace edema today is.  Neurological: She is alert and oriented to person,  place, and time.  Psychiatric: She has a normal mood and affect. Her behavior is normal.   BP 122/69 mmHg  Pulse 80  Temp(Src) 97.3 F (36.3 C) (Oral)  Ht 5\' 4"  (1.626 m)  Wt 214 lb 9.6 oz (97.342 kg)  BMI 36.82 kg/m2        Assessment & Plan:  1. Essential hypertension Blood pressure is good today  at 122/69. Continue lisinopril  2. Chronic obstructive pulmonary disease, unspecified COPD type (HCC) No recent exacerbations. She continues with Symbicort twice a day.  Frederica KusterStephen M Haywood Meinders MD

## 2015-12-17 DIAGNOSIS — I10 Essential (primary) hypertension: Secondary | ICD-10-CM | POA: Diagnosis not present

## 2015-12-17 DIAGNOSIS — S81812D Laceration without foreign body, left lower leg, subsequent encounter: Secondary | ICD-10-CM | POA: Diagnosis not present

## 2015-12-17 DIAGNOSIS — L97821 Non-pressure chronic ulcer of other part of left lower leg limited to breakdown of skin: Secondary | ICD-10-CM | POA: Diagnosis not present

## 2015-12-17 DIAGNOSIS — Z79899 Other long term (current) drug therapy: Secondary | ICD-10-CM | POA: Diagnosis not present

## 2015-12-17 DIAGNOSIS — I83028 Varicose veins of left lower extremity with ulcer other part of lower leg: Secondary | ICD-10-CM | POA: Diagnosis not present

## 2015-12-17 DIAGNOSIS — E785 Hyperlipidemia, unspecified: Secondary | ICD-10-CM | POA: Diagnosis not present

## 2015-12-17 DIAGNOSIS — I890 Lymphedema, not elsewhere classified: Secondary | ICD-10-CM | POA: Diagnosis not present

## 2015-12-17 DIAGNOSIS — J449 Chronic obstructive pulmonary disease, unspecified: Secondary | ICD-10-CM | POA: Diagnosis not present

## 2015-12-17 DIAGNOSIS — E039 Hypothyroidism, unspecified: Secondary | ICD-10-CM | POA: Diagnosis not present

## 2015-12-18 ENCOUNTER — Ambulatory Visit (INDEPENDENT_AMBULATORY_CARE_PROVIDER_SITE_OTHER): Payer: Medicare Other | Admitting: Family Medicine

## 2015-12-18 DIAGNOSIS — I110 Hypertensive heart disease with heart failure: Secondary | ICD-10-CM

## 2015-12-18 DIAGNOSIS — Z7982 Long term (current) use of aspirin: Secondary | ICD-10-CM | POA: Diagnosis not present

## 2015-12-18 DIAGNOSIS — I2510 Atherosclerotic heart disease of native coronary artery without angina pectoris: Secondary | ICD-10-CM | POA: Diagnosis not present

## 2015-12-18 DIAGNOSIS — I69354 Hemiplegia and hemiparesis following cerebral infarction affecting left non-dominant side: Secondary | ICD-10-CM

## 2015-12-18 DIAGNOSIS — I509 Heart failure, unspecified: Secondary | ICD-10-CM

## 2015-12-31 ENCOUNTER — Encounter: Payer: Self-pay | Admitting: Family Medicine

## 2015-12-31 ENCOUNTER — Ambulatory Visit (INDEPENDENT_AMBULATORY_CARE_PROVIDER_SITE_OTHER): Payer: Medicare Other | Admitting: Family Medicine

## 2015-12-31 VITALS — BP 113/66 | HR 88 | Temp 98.5°F

## 2015-12-31 DIAGNOSIS — L304 Erythema intertrigo: Secondary | ICD-10-CM | POA: Diagnosis not present

## 2015-12-31 MED ORDER — NYSTATIN 100000 UNIT/GM EX CREA: 1.0000 "application " | TOPICAL_CREAM | Freq: Two times a day (BID) | CUTANEOUS | Status: DC

## 2015-12-31 NOTE — Progress Notes (Signed)
   Subjective:    Patient ID: Betty Keller, female    DOB: March 01, 1936, 80 y.o.   MRN: [id number]  HPI 80 year old female with intense red rash under both breasts. There is some burning and itching associated. Of note she has been on Keflex 500 mg 3 times a day for an extended period of time per the vascular surgeons. She has chronic venous insufficiency. Her leg wounds have generally cleared up now but there are still lots of erythema and swelling despite 2 heavy-duty stockings.  Patient Active Problem List   Diagnosis Date Noted  . Hypothyroid 09/18/2013  . Bronchitis, chronic obstructive w acute bronchitis (HCC) 06/29/2013  . COPD (chronic obstructive pulmonary disease) (HCC) 06/27/2013  . Chest congestion 06/27/2013  . Thyroid disease   . Acute upper respiratory infections of unspecified site 03/28/2013  . Hyperlipidemia   . Unspecified vitamin D deficiency 01/24/2013  . Cerumen impaction 11/15/2012  . Chronic venous insufficiency 11/15/2012  . Pedal edema 11/15/2012  . GERD (gastroesophageal reflux disease) 10/13/2010  . HTN (hypertension) 10/13/2010  . IBS (irritable bowel syndrome) 10/13/2010  . Osteoarthritis of shoulder 10/13/2010  . Obesity 10/13/2010  . Rectocele 10/13/2010  . Insomnia 10/13/2010  . Bundle branch block, left 10/13/2010  . Asthma 10/13/2010   Outpatient Encounter Prescriptions as of 12/31/2015  Medication Sig  . budesonide-formoterol (SYMBICORT) 160-4.5 MCG/ACT inhaler Inhale 2 puffs into the lungs 2 (two) times daily.  . cephALEXin (KEFLEX) 500 MG capsule Take 1 capsule (500 mg total) by mouth 3 (three) times daily.  . Cholecalciferol (VITAMIN D) 2000 UNITS CAPS Take 4,000 Units by mouth daily. One daily  . Flaxseed, Linseed, (FLAX SEED OIL PO) Take 1 capsule by mouth daily.  . furosemide (LASIX) 40 MG tablet Take 1 tablet (40 mg total) by mouth daily.  Marland Kitchen. lisinopril-hydrochlorothiazide (PRINZIDE,ZESTORETIC) 10-12.5 MG per tablet Take 1 tablet by mouth  daily.  . metolazone (ZAROXOLYN) 2.5 MG tablet Take one tablet on Monday, Wednesday and Friday  . Multiple Vitamins-Minerals (ICAPS AREDS 2 PO) Take by mouth.  . mupirocin ointment (BACTROBAN) 2 % Apply 1 application topically 3 (three) times daily.  . nortriptyline (PAMELOR) 75 MG capsule Take 1 capsule (75 mg total) by mouth at bedtime.  . ranitidine (ZANTAC) 150 MG tablet Take 1 tablet (150 mg total) by mouth 2 (two) times daily.  . simvastatin (ZOCOR) 40 MG tablet Take 1 tablet (40 mg total) by mouth daily with breakfast.  . triamcinolone cream (KENALOG) 0.1 % Apply topically 3 (three) times daily.   No facility-administered encounter medications on file as of 12/31/2015.      Review of Systems  Skin: Positive for color change and rash.       Objective:   Physical Exam  Constitutional: She appears well-developed and well-nourished.  Skin:  Area under both breasts intensely inflamed consistent with intertrigo or yeast infection.   BP 113/66 mmHg  Pulse 88  Temp(Src) 98.5 F (36.9 C) (Oral)  Wt         Assessment & Plan:  1. Intertrigo We'll treat with nystatin cream to be applied twice a day for 2 weeks. If symptoms are not improved consider use of fluconazole. I have asked her to go without a bra and leave the area open to the air as much as feasible.  Frederica KusterStephen M Miller MD

## 2015-12-31 NOTE — Addendum Note (Signed)
Addended by: Fawn KirkHOLT, CATHY on: 12/31/2015 01:43 PM   Modules accepted: Orders

## 2016-02-05 DIAGNOSIS — I890 Lymphedema, not elsewhere classified: Secondary | ICD-10-CM | POA: Diagnosis not present

## 2016-02-12 ENCOUNTER — Ambulatory Visit: Payer: Medicare Other | Admitting: Family Medicine

## 2016-04-02 ENCOUNTER — Encounter: Payer: Self-pay | Admitting: Family Medicine

## 2016-04-02 ENCOUNTER — Ambulatory Visit (INDEPENDENT_AMBULATORY_CARE_PROVIDER_SITE_OTHER): Payer: Medicare Other | Admitting: Family Medicine

## 2016-04-02 VITALS — BP 106/56 | HR 90 | Temp 98.3°F | Ht 64.0 in | Wt 232.0 lb

## 2016-04-02 DIAGNOSIS — I872 Venous insufficiency (chronic) (peripheral): Secondary | ICD-10-CM | POA: Diagnosis not present

## 2016-04-02 DIAGNOSIS — E785 Hyperlipidemia, unspecified: Secondary | ICD-10-CM

## 2016-04-02 DIAGNOSIS — J449 Chronic obstructive pulmonary disease, unspecified: Secondary | ICD-10-CM | POA: Diagnosis not present

## 2016-04-02 DIAGNOSIS — I10 Essential (primary) hypertension: Secondary | ICD-10-CM

## 2016-04-02 DIAGNOSIS — E039 Hypothyroidism, unspecified: Secondary | ICD-10-CM | POA: Diagnosis not present

## 2016-04-02 MED ORDER — NORTRIPTYLINE HCL 75 MG PO CAPS: 75.0000 mg | ORAL_CAPSULE | Freq: Every day | ORAL | 1 refills | Status: DC

## 2016-04-02 MED ORDER — NYSTATIN 100000 UNIT/GM EX CREA: 1.0000 "application " | TOPICAL_CREAM | Freq: Two times a day (BID) | CUTANEOUS | 2 refills | Status: DC

## 2016-04-02 NOTE — Progress Notes (Signed)
Subjective:    Patient ID: Betty Keller, female    DOB: [birth year], 80 y.o.   MRN: [id number]  HPI Pt here for follow up and management of chronic medical problems which includes hypothyroid and hyperlipidemia. She is taking medication regularly. Legs are having more swelling especially left. She has had issues in the past with venous stasis and ulcers and has been a patient of the wound clinic. She does have some pneumatic devices which she wears her 1 hour twice a day. Currently she takes Lasix 40 mg a day and metolazone 2.5 mg 3 days a week. She does wear compression stockings. She denies any shortness of breath.     Patient Active Problem List   Diagnosis Date Noted  . Hypothyroid 09/18/2013  . Bronchitis, chronic obstructive w acute bronchitis (HCC) 06/29/2013  . COPD (chronic obstructive pulmonary disease) (HCC) 06/27/2013  . Chest congestion 06/27/2013  . Thyroid disease   . Acute upper respiratory infections of unspecified site 03/28/2013  . Hyperlipidemia   . Unspecified vitamin D deficiency 01/24/2013  . Cerumen impaction 11/15/2012  . Chronic venous insufficiency 11/15/2012  . Pedal edema 11/15/2012  . GERD (gastroesophageal reflux disease) 10/13/2010  . HTN (hypertension) 10/13/2010  . IBS (irritable bowel syndrome) 10/13/2010  . Osteoarthritis of shoulder 10/13/2010  . Obesity 10/13/2010  . Rectocele 10/13/2010  . Insomnia 10/13/2010  . Bundle branch block, left 10/13/2010  . Asthma 10/13/2010   Outpatient Encounter Prescriptions as of 04/02/2016  Medication Sig  . budesonide-formoterol (SYMBICORT) 160-4.5 MCG/ACT inhaler Inhale 2 puffs into the lungs 2 (two) times daily.  . cephALEXin (KEFLEX) 500 MG capsule Take 1 capsule (500 mg total) by mouth 3 (three) times daily.  . Cholecalciferol (VITAMIN D) 2000 UNITS CAPS Take 4,000 Units by mouth daily. One daily  . Flaxseed, Linseed, (FLAX SEED OIL PO) Take 1 capsule by mouth daily.  . furosemide (LASIX) 40 MG  tablet Take 1 tablet (40 mg total) by mouth daily.  Marland Kitchen lisinopril-hydrochlorothiazide (PRINZIDE,ZESTORETIC) 10-12.5 MG per tablet Take 1 tablet by mouth daily.  . metolazone (ZAROXOLYN) 2.5 MG tablet Take one tablet on Monday, Wednesday and Friday  . Multiple Vitamins-Minerals (ICAPS AREDS 2 PO) Take by mouth.  . mupirocin ointment (BACTROBAN) 2 % Apply 1 application topically 3 (three) times daily.  . nortriptyline (PAMELOR) 75 MG capsule Take 1 capsule (75 mg total) by mouth at bedtime.  Marland Kitchen nystatin cream (MYCOSTATIN) Apply 1 application topically 2 (two) times daily.  . ranitidine (ZANTAC) 150 MG tablet Take 1 tablet (150 mg total) by mouth 2 (two) times daily.  . simvastatin (ZOCOR) 40 MG tablet Take 1 tablet (40 mg total) by mouth daily with breakfast.  . triamcinolone cream (KENALOG) 0.1 % Apply topically 3 (three) times daily.  . [DISCONTINUED] nortriptyline (PAMELOR) 75 MG capsule Take 1 capsule (75 mg total) by mouth at bedtime.  . [DISCONTINUED] nortriptyline (PAMELOR) 75 MG capsule Take 1 capsule (75 mg total) by mouth at bedtime.  . [DISCONTINUED] nystatin cream (MYCOSTATIN) Apply 1 application topically 2 (two) times daily. For two weeks   No facility-administered encounter medications on file as of 04/02/2016.       Review of Systems  Constitutional: Negative.   HENT: Negative.   Eyes: Negative.   Respiratory: Negative.   Cardiovascular: Positive for leg swelling (left leg).  Gastrointestinal: Negative.   Endocrine: Negative.   Genitourinary: Negative.   Musculoskeletal: Negative.   Skin: Negative.        Right lower  leg lesion   Allergic/Immunologic: Negative.   Neurological: Negative.   Hematological: Negative.   Psychiatric/Behavioral: Negative.        Objective:   Physical Exam  Constitutional: She appears well-developed and well-nourished.  Cardiovascular: Normal rate and normal heart sounds.   Pulmonary/Chest: Effort normal and breath sounds normal.    Musculoskeletal: She exhibits edema.  There is 4+ edema in legs bilaterally    BP (!) 106/56 (BP Location: Right Arm)   Pulse 90   Temp 98.3 F (36.8 C) (Oral)   Ht 5\' 4"  (1.626 m)   Wt 232 lb (105.2 kg)   BMI 39.82 kg/m          5. Chronic venous insufficiency Swelling is worse. We'll increase diuretic from 40 mg to 80 mg a day furosemide for 3 days. Also increase metolazone from 2.5-5 mg Monday Wednesday Friday. I will check her back next week to see how things are going  Frederica KusterStephen M Miller MD

## 2016-04-02 NOTE — Patient Instructions (Signed)
Medicare Annual Wellness Visit  Lea and the medical providers at [facility] strive to bring you the best medical care.  In doing so we not only want to address your current medical conditions and concerns but also to detect new conditions early and prevent illness, disease and health-related problems.    Medicare offers a yearly Wellness Visit which allows our clinical staff to assess your need for preventative services including immunizations, lifestyle education, counseling to decrease risk of preventable diseases and screening for fall risk and other medical concerns.    This visit is provided free of charge (no copay) for all Medicare recipients. The clinical pharmacists at [facility] have begun to conduct these Wellness Visits which will also include a thorough review of all your medications.    As you primary medical provider recommend that you make an appointment for your Annual Wellness Visit if you have not done so already this year.  You may set up this appointment before you leave today or you may call back ([phone]) and schedule an appointment.  Please make sure when you call that you mention that you are scheduling your Annual Wellness Visit with the clinical pharmacist so that the appointment may be made for the proper length of time.     Continue current medications. Continue good therapeutic lifestyle changes which include good diet and exercise. Fall precautions discussed with patient. If an FOBT was given today- please return it to our front desk. If you are over 50 years old - you may need Prevnar 13 or the adult Pneumonia vaccine.  **Flu shots are available--- please call and schedule a FLU-CLINIC appointment**  After your visit with us today you will receive a survey in the mail or online from [employer] regarding your care with us. Please take a moment to fill this out. Your feedback is very  important to us as you can help us better understand your patient needs as well as improve your experience and satisfaction. WE CARE ABOUT YOU!!!    

## 2016-04-03 LAB — CMP14+EGFR
ALT: 15 IU/L (ref 0–32)
AST: 18 IU/L (ref 0–40)
Albumin/Globulin Ratio: 1.1 — ABNORMAL LOW (ref 1.2–2.2)
Albumin: 3.7 g/dL (ref 3.5–4.8)
Alkaline Phosphatase: 95 IU/L (ref 39–117)
BUN/Creatinine Ratio: 17 (ref 12–28)
BUN: 19 mg/dL (ref 8–27)
Bilirubin Total: 0.3 mg/dL (ref 0.0–1.2)
CO2: 29 mmol/L (ref 18–29)
Calcium: 9.1 mg/dL (ref 8.7–10.3)
Chloride: 98 mmol/L (ref 96–106)
Creatinine, Ser: 1.09 mg/dL — ABNORMAL HIGH (ref 0.57–1.00)
GFR calc Af Amer: 56 mL/min/{1.73_m2} — ABNORMAL LOW (ref >59)
GFR calc non Af Amer: 49 mL/min/{1.73_m2} — ABNORMAL LOW (ref >59)
Globulin, Total: 3.4 g/dL (ref 1.5–4.5)
Glucose: 88 mg/dL (ref 65–99)
Potassium: 4.6 mmol/L (ref 3.5–5.2)
Sodium: 143 mmol/L (ref 134–144)
Total Protein: 7.1 g/dL (ref 6.0–8.5)

## 2016-04-03 LAB — LIPID PANEL
Chol/HDL Ratio: 2.4 ratio units (ref 0.0–4.4)
Cholesterol, Total: 167 mg/dL (ref 100–199)
HDL: 71 mg/dL (ref >39)
LDL Calculated: 80 mg/dL (ref 0–99)
Triglycerides: 80 mg/dL (ref 0–149)
VLDL Cholesterol Cal: 16 mg/dL (ref 5–40)

## 2016-04-03 LAB — TSH: TSH: 3.66 u[IU]/mL (ref 0.450–4.500)

## 2016-04-06 ENCOUNTER — Telehealth: Payer: Self-pay | Admitting: Family Medicine

## 2016-04-06 NOTE — Telephone Encounter (Signed)
Patient aware of lab results.

## 2016-04-08 ENCOUNTER — Ambulatory Visit (INDEPENDENT_AMBULATORY_CARE_PROVIDER_SITE_OTHER): Payer: Medicare Other | Admitting: Family Medicine

## 2016-04-08 ENCOUNTER — Encounter: Payer: Self-pay | Admitting: Family Medicine

## 2016-04-08 VITALS — BP 103/60 | HR 84 | Temp 97.8°F | Ht 64.0 in | Wt 222.4 lb

## 2016-04-08 DIAGNOSIS — I872 Venous insufficiency (chronic) (peripheral): Secondary | ICD-10-CM | POA: Diagnosis not present

## 2016-04-08 DIAGNOSIS — I10 Essential (primary) hypertension: Secondary | ICD-10-CM | POA: Diagnosis not present

## 2016-04-08 DIAGNOSIS — L989 Disorder of the skin and subcutaneous tissue, unspecified: Secondary | ICD-10-CM

## 2016-04-08 DIAGNOSIS — Z0000 Encounter for general adult medical examination without abnormal findings: Secondary | ICD-10-CM | POA: Diagnosis not present

## 2016-04-08 DIAGNOSIS — L820 Inflamed seborrheic keratosis: Secondary | ICD-10-CM | POA: Diagnosis not present

## 2016-04-08 DIAGNOSIS — L988 Other specified disorders of the skin and subcutaneous tissue: Secondary | ICD-10-CM | POA: Diagnosis not present

## 2016-04-08 DIAGNOSIS — I447 Left bundle-branch block, unspecified: Secondary | ICD-10-CM

## 2016-04-08 NOTE — Addendum Note (Signed)
Addended by: Caryl BisBOWMAN, Berea Majkowski M on: 04/08/2016 03:08 PM   Modules accepted: Orders

## 2016-04-08 NOTE — Progress Notes (Signed)
Subjective:    Patient ID: Betty Keller, female    DOB: 10-17-1935, 80 y.o.   MRN: [id number]  HPI Patient here today for one week recheck on leg edema. She was seen 5 days ago for worsening dependent edema. Weight that day was 232 and it is down to 222 today. The edema has improved. Lasix dose was doubled from 40-80 mg for 3 days and metolazone was increased from 2.5-5 mg on Monday Wednesday Friday. Her legs still have some stasis dermatitis and scaliness.        Patient Active Problem List   Diagnosis Date Noted  . Hypothyroid 09/18/2013  . Bronchitis, chronic obstructive w acute bronchitis (HCC) 06/29/2013  . COPD (chronic obstructive pulmonary disease) (HCC) 06/27/2013  . Chest congestion 06/27/2013  . Thyroid disease   . Acute upper respiratory infections of unspecified site 03/28/2013  . Hyperlipidemia   . Unspecified vitamin D deficiency 01/24/2013  . Cerumen impaction 11/15/2012  . Chronic venous insufficiency 11/15/2012  . Pedal edema 11/15/2012  . GERD (gastroesophageal reflux disease) 10/13/2010  . HTN (hypertension) 10/13/2010  . IBS (irritable bowel syndrome) 10/13/2010  . Osteoarthritis of shoulder 10/13/2010  . Obesity 10/13/2010  . Rectocele 10/13/2010  . Insomnia 10/13/2010  . Bundle branch block, left 10/13/2010  . Asthma 10/13/2010   Outpatient Encounter Prescriptions as of 04/08/2016  Medication Sig  . budesonide-formoterol (SYMBICORT) 160-4.5 MCG/ACT inhaler Inhale 2 puffs into the lungs 2 (two) times daily.  . cephALEXin (KEFLEX) 500 MG capsule Take 1 capsule (500 mg total) by mouth 3 (three) times daily.  . Cholecalciferol (VITAMIN D) 2000 UNITS CAPS Take 4,000 Units by mouth daily. One daily  . Flaxseed, Linseed, (FLAX SEED OIL PO) Take 1 capsule by mouth daily.  . furosemide (LASIX) 40 MG tablet Take 1 tablet (40 mg total) by mouth daily.  Marland Kitchen lisinopril-hydrochlorothiazide (PRINZIDE,ZESTORETIC) 10-12.5 MG per tablet Take 1 tablet by mouth daily.    . metolazone (ZAROXOLYN) 2.5 MG tablet Take one tablet on Monday, Wednesday and Friday  . Multiple Vitamins-Minerals (ICAPS AREDS 2 PO) Take by mouth.  . mupirocin ointment (BACTROBAN) 2 % Apply 1 application topically 3 (three) times daily.  . nortriptyline (PAMELOR) 75 MG capsule Take 1 capsule (75 mg total) by mouth at bedtime.  Marland Kitchen nystatin cream (MYCOSTATIN) Apply 1 application topically 2 (two) times daily.  . ranitidine (ZANTAC) 150 MG tablet Take 1 tablet (150 mg total) by mouth 2 (two) times daily.  . simvastatin (ZOCOR) 40 MG tablet Take 1 tablet (40 mg total) by mouth daily with breakfast.  . triamcinolone cream (KENALOG) 0.1 % Apply topically 3 (three) times daily.   No facility-administered encounter medications on file as of 04/08/2016.      Review of Systems  Constitutional: Negative.   HENT: Negative.   Eyes: Negative.   Respiratory: Negative.   Cardiovascular: Positive for leg swelling.  Gastrointestinal: Negative.   Endocrine: Negative.   Genitourinary: Negative.   Musculoskeletal: Negative.   Skin: Negative.   Allergic/Immunologic: Negative.   Neurological: Negative.   Hematological: Negative.   Psychiatric/Behavioral: Negative.        Objective:   Physical Exam  Constitutional: She is oriented to person, place, and time. She appears well-developed and well-nourished.  Cardiovascular: Normal rate.   Pulmonary/Chest: Effort normal and breath sounds normal.  Neurological: She is alert and oriented to person, place, and time.  Skin:  Patient has a small seborrheic keratosis that was frozen last week looks like it's about  to fall off but it went ahead and did a shave excision today and hemostasis obtained with Monsel's solution. For the scaliness of her legs I have recommended some olive oil to be used intermittently.  Psychiatric: She has a normal mood and affect. Her behavior is normal.   BP 103/60   Pulse 84   Temp 97.8 F (36.6 C) (Oral)   Ht 5\' 4"   (1.626 m)   Wt 222 lb 6.4 oz (100.9 kg)   BMI 38.17 kg/m         Assessment & Plan:  1. Essential hypertension Blood pressure is well managed on current regimen  2. Chronic venous insufficiency With weight loss of 10 pounds her edema has improved. I would like for her to get a scale and weigh herself every day. If weight goes up more than 3 pounds from one day to the next I have instructed her to take an extra dose of furosemide. Recheck in one month  Frederica KusterStephen M Jarrel Knoke MD

## 2016-04-09 DIAGNOSIS — H04123 Dry eye syndrome of bilateral lacrimal glands: Secondary | ICD-10-CM | POA: Diagnosis not present

## 2016-04-09 DIAGNOSIS — Z961 Presence of intraocular lens: Secondary | ICD-10-CM | POA: Diagnosis not present

## 2016-04-09 DIAGNOSIS — H353132 Nonexudative age-related macular degeneration, bilateral, intermediate dry stage: Secondary | ICD-10-CM | POA: Diagnosis not present

## 2016-04-10 LAB — PATHOLOGY

## 2016-05-14 ENCOUNTER — Ambulatory Visit: Payer: Medicare Other | Admitting: Family Medicine

## 2016-05-20 ENCOUNTER — Ambulatory Visit: Payer: Medicare Other | Admitting: Family Medicine

## 2016-05-28 ENCOUNTER — Encounter: Payer: Self-pay | Admitting: Family Medicine

## 2016-05-28 ENCOUNTER — Ambulatory Visit (INDEPENDENT_AMBULATORY_CARE_PROVIDER_SITE_OTHER): Payer: Medicare Other | Admitting: Family Medicine

## 2016-05-28 VITALS — BP 126/76 | HR 86 | Temp 97.9°F | Ht 64.0 in | Wt 228.0 lb

## 2016-05-28 DIAGNOSIS — J449 Chronic obstructive pulmonary disease, unspecified: Secondary | ICD-10-CM | POA: Diagnosis not present

## 2016-05-28 DIAGNOSIS — I10 Essential (primary) hypertension: Secondary | ICD-10-CM | POA: Diagnosis not present

## 2016-05-28 DIAGNOSIS — R600 Localized edema: Secondary | ICD-10-CM | POA: Diagnosis not present

## 2016-05-28 MED ORDER — SIMVASTATIN 40 MG PO TABS
40.0000 mg | ORAL_TABLET | Freq: Every day | ORAL | 1 refills | Status: DC
Start: 2016-05-28 — End: 2016-12-02

## 2016-05-28 MED ORDER — MUPIROCIN 2 % EX OINT: 1.0000 "application " | TOPICAL_OINTMENT | Freq: Three times a day (TID) | CUTANEOUS | 6 refills | Status: DC

## 2016-05-28 MED ORDER — TRIAMCINOLONE ACETONIDE 0.1 % EX CREA: TOPICAL_CREAM | Freq: Three times a day (TID) | CUTANEOUS | 1 refills | Status: DC

## 2016-05-28 MED ORDER — LISINOPRIL-HYDROCHLOROTHIAZIDE 10-12.5 MG PO TABS
1.0000 | ORAL_TABLET | Freq: Every day | ORAL | 1 refills | Status: DC
Start: 2016-05-28 — End: 2016-12-02

## 2016-05-28 NOTE — Progress Notes (Signed)
Subjective:    Patient ID: Betty Keller, female    DOB: Nov 26, 1935, 80 y.o.   MRN: [id number]   80 year old female here to follow-up dependent edema hypertension and COPD. She had a fall a few weeks ago and has a large bruise on her for head that was not knocked unconscious. Normally she ambulates with a walker but was not using her walker at that time. Her edema has improved. She uses Lasix 40 mg a day with Zaroxolyn on Monday Wednesday and Friday. Patient Active Problem List   Diagnosis Date Noted  . Hypothyroid 09/18/2013  . Bronchitis, chronic obstructive w acute bronchitis (HCC) 06/29/2013  . COPD (chronic obstructive pulmonary disease) (HCC) 06/27/2013  . Chest congestion 06/27/2013  . Thyroid disease   . Acute upper respiratory infections of unspecified site 03/28/2013  . Hyperlipidemia   . Unspecified vitamin D deficiency 01/24/2013  . Cerumen impaction 11/15/2012  . Chronic venous insufficiency 11/15/2012  . Pedal edema 11/15/2012  . GERD (gastroesophageal reflux disease) 10/13/2010  . HTN (hypertension) 10/13/2010  . IBS (irritable bowel syndrome) 10/13/2010  . Osteoarthritis of shoulder 10/13/2010  . Obesity 10/13/2010  . Rectocele 10/13/2010  . Insomnia 10/13/2010  . Bundle branch block, left 10/13/2010  . Asthma 10/13/2010   Outpatient Encounter Prescriptions as of 05/28/2016  Medication Sig  . budesonide-formoterol (SYMBICORT) 160-4.5 MCG/ACT inhaler Inhale 2 puffs into the lungs 2 (two) times daily.  . Cholecalciferol (VITAMIN D) 2000 UNITS CAPS Take 4,000 Units by mouth daily. One daily  . Flaxseed, Linseed, (FLAX SEED OIL PO) Take 1 capsule by mouth daily.  . furosemide (LASIX) 40 MG tablet Take 1 tablet (40 mg total) by mouth daily.  Marland Kitchen. lisinopril-hydrochlorothiazide (PRINZIDE,ZESTORETIC) 10-12.5 MG tablet Take 1 tablet by mouth daily.  . metolazone (ZAROXOLYN) 2.5 MG tablet Take one tablet on Monday, Wednesday and Friday  . Multiple Vitamins-Minerals (ICAPS  AREDS 2 PO) Take by mouth.  . mupirocin ointment (BACTROBAN) 2 % Apply 1 application topically 3 (three) times daily.  . nortriptyline (PAMELOR) 75 MG capsule Take 1 capsule (75 mg total) by mouth at bedtime.  Marland Kitchen. nystatin cream (MYCOSTATIN) Apply 1 application topically 2 (two) times daily.  . ranitidine (ZANTAC) 150 MG tablet Take 1 tablet (150 mg total) by mouth 2 (two) times daily.  . simvastatin (ZOCOR) 40 MG tablet Take 1 tablet (40 mg total) by mouth daily with breakfast.  . triamcinolone cream (KENALOG) 0.1 % Apply topically 3 (three) times daily.  . [DISCONTINUED] lisinopril-hydrochlorothiazide (PRINZIDE,ZESTORETIC) 10-12.5 MG per tablet Take 1 tablet by mouth daily.  . [DISCONTINUED] mupirocin ointment (BACTROBAN) 2 % Apply 1 application topically 3 (three) times daily.  . [DISCONTINUED] simvastatin (ZOCOR) 40 MG tablet Take 1 tablet (40 mg total) by mouth daily with breakfast.  . [DISCONTINUED] triamcinolone cream (KENALOG) 0.1 % Apply topically 3 (three) times daily.  . [DISCONTINUED] cephALEXin (KEFLEX) 500 MG capsule Take 1 capsule (500 mg total) by mouth 3 (three) times daily.   No facility-administered encounter medications on file as of 05/28/2016.     HPI Review of Systems  Constitutional: Positive for fatigue.  Respiratory: Positive for shortness of breath.   Cardiovascular: Positive for leg swelling.  Neurological: Negative.   Psychiatric/Behavioral: Negative.        Objective:   Physical Exam  Constitutional: She is oriented to person, place, and time. She appears well-developed and well-nourished.  HENT:  Mouth/Throat: Oropharynx is clear and moist.  Cardiovascular: Normal rate, regular rhythm and normal heart sounds.  Pulmonary/Chest: Effort normal and breath sounds normal.  Musculoskeletal: Edema: legs are always 3-4+ swollen but there is no breakdown of the skin today.  Neurological: She is alert and oriented to person, place, and time.  Psychiatric: She has a  normal mood and affect. Her behavior is normal.   BP 126/76   Pulse 86   Temp 97.9 F (36.6 C) (Oral)   Ht 5\' 4"  (1.626 m)   Wt 228 lb (103.4 kg)   BMI 39.14 kg/m         Assessment & Plan:  1. Essential hypertension Blood pressure is good at 126/76. Continue lisinopril - lisinopril-hydrochlorothiazide (PRINZIDE,ZESTORETIC) 10-12.5 MG tablet; Take 1 tablet by mouth daily.  Dispense: 90 tablet; Refill: 1  2. Chronic obstructive pulmonary disease, unspecified COPD type (HCC) Breathing is stable. She uses Symbicort regularly  3. Pedal edema Edema is much the same but no worse.  Frederica KusterStephen M Miller MD

## 2016-09-03 ENCOUNTER — Encounter: Payer: Self-pay | Admitting: Family Medicine

## 2016-09-03 ENCOUNTER — Ambulatory Visit (INDEPENDENT_AMBULATORY_CARE_PROVIDER_SITE_OTHER): Payer: Medicare Other | Admitting: Family Medicine

## 2016-09-03 VITALS — BP 116/66 | HR 96 | Temp 98.2°F

## 2016-09-03 DIAGNOSIS — R600 Localized edema: Secondary | ICD-10-CM

## 2016-09-03 DIAGNOSIS — I10 Essential (primary) hypertension: Secondary | ICD-10-CM

## 2016-09-03 MED ORDER — FLUCONAZOLE 150 MG PO TABS: 150.0000 mg | ORAL_TABLET | Freq: Once | ORAL | 0 refills | Status: AC

## 2016-09-03 NOTE — Progress Notes (Signed)
Subjective:    Patient ID: Betty Keller, female    DOB: Oct 23, 1935, 81 y.o.   MRN: [id number]  HPI patient is here to follow-up her chronic dependent edema left worse than right with intermittent skin breakdown. She has been taking Lasix regularly along with metolazone. Sometimes Lasix seemed to have more effect than other times. In the past we have used Unna boots effectively when there was skin breakdown or skin really looked in poor shape. She denies any chest pain or shortness of breath. Her renal function shows estimated GFR 49 with creatinine clearance of 36.7  Patient Active Problem List   Diagnosis Date Noted  . Hypothyroid 09/18/2013  . Bronchitis, chronic obstructive w acute bronchitis (HCC) 06/29/2013  . COPD (chronic obstructive pulmonary disease) (HCC) 06/27/2013  . Chest congestion 06/27/2013  . Thyroid disease   . Acute upper respiratory infections of unspecified site 03/28/2013  . Hyperlipidemia   . Unspecified vitamin D deficiency 01/24/2013  . Cerumen impaction 11/15/2012  . Chronic venous insufficiency 11/15/2012  . Pedal edema 11/15/2012  . GERD (gastroesophageal reflux disease) 10/13/2010  . HTN (hypertension) 10/13/2010  . IBS (irritable bowel syndrome) 10/13/2010  . Osteoarthritis of shoulder 10/13/2010  . Obesity 10/13/2010  . Rectocele 10/13/2010  . Insomnia 10/13/2010  . Bundle branch block, left 10/13/2010  . Asthma 10/13/2010   Outpatient Encounter Prescriptions as of 09/03/2016  Medication Sig  . budesonide-formoterol (SYMBICORT) 160-4.5 MCG/ACT inhaler Inhale 2 puffs into the lungs 2 (two) times daily.  . Cholecalciferol (VITAMIN D) 2000 UNITS CAPS Take 4,000 Units by mouth daily. One daily  . Flaxseed, Linseed, (FLAX SEED OIL PO) Take 1 capsule by mouth daily.  . furosemide (LASIX) 40 MG tablet Take 1 tablet (40 mg total) by mouth daily.  Marland Kitchen lisinopril-hydrochlorothiazide (PRINZIDE,ZESTORETIC) 10-12.5 MG tablet Take 1 tablet by mouth daily.  .  metolazone (ZAROXOLYN) 2.5 MG tablet Take one tablet on Monday, Wednesday and Friday  . Multiple Vitamins-Minerals (ICAPS AREDS 2 PO) Take by mouth.  . mupirocin ointment (BACTROBAN) 2 % Apply 1 application topically 3 (three) times daily.  . nortriptyline (PAMELOR) 75 MG capsule Take 1 capsule (75 mg total) by mouth at bedtime.  Marland Kitchen nystatin cream (MYCOSTATIN) Apply 1 application topically 2 (two) times daily.  . ranitidine (ZANTAC) 150 MG tablet Take 1 tablet (150 mg total) by mouth 2 (two) times daily.  . simvastatin (ZOCOR) 40 MG tablet Take 1 tablet (40 mg total) by mouth daily with breakfast.  . triamcinolone cream (KENALOG) 0.1 % Apply topically 3 (three) times daily.   No facility-administered encounter medications on file as of 09/03/2016.       Review of Systems  Constitutional: Negative.   Respiratory: Negative.   Cardiovascular: Negative.   Skin:       Both legs show stasis dermatitis with a scaly eruption  Psychiatric/Behavioral: Negative.        Objective:   Physical Exam  Constitutional: She appears well-developed and well-nourished.  Cardiovascular: Normal rate and regular rhythm.   Pulmonary/Chest: Effort normal and breath sounds normal.  Musculoskeletal:  Legs: 4+ swollen but no weeping edema today. There is an area of concern on the inner aspect of the left lower leg that is close to breakdown. I would like to wrap this leg with an Unna boot and Ace. For the edema I have suggested that she may take an extra Lasix on days when Lasix does not seem to be effective. I think she can manage  BP 116/66   Pulse 96   Temp 98.2 F (36.8 C) (Oral)         Assessment & Plan:  1. Essential hypertension A pressures well controlled on current regimen lisinopril hydrochlorothiazide  2. Pedal edema Stasis venous disease along with decline in renal function. Have suggested increased dose of Lasix on a when necessary basis. I've also suggested she may try olive oil on her  legs for the dermatitis.  Frederica KusterStephen M Miller MD

## 2016-10-06 ENCOUNTER — Encounter (HOSPITAL_BASED_OUTPATIENT_CLINIC_OR_DEPARTMENT_OTHER): Payer: Medicare Other | Attending: Surgery

## 2016-10-06 DIAGNOSIS — L97222 Non-pressure chronic ulcer of left calf with fat layer exposed: Secondary | ICD-10-CM | POA: Insufficient documentation

## 2016-10-06 DIAGNOSIS — L97221 Non-pressure chronic ulcer of left calf limited to breakdown of skin: Secondary | ICD-10-CM | POA: Diagnosis not present

## 2016-10-06 DIAGNOSIS — J449 Chronic obstructive pulmonary disease, unspecified: Secondary | ICD-10-CM | POA: Insufficient documentation

## 2016-10-06 DIAGNOSIS — I83022 Varicose veins of left lower extremity with ulcer of calf: Secondary | ICD-10-CM | POA: Diagnosis not present

## 2016-10-06 DIAGNOSIS — I890 Lymphedema, not elsewhere classified: Secondary | ICD-10-CM | POA: Diagnosis not present

## 2016-10-06 DIAGNOSIS — I10 Essential (primary) hypertension: Secondary | ICD-10-CM | POA: Insufficient documentation

## 2016-10-13 DIAGNOSIS — I890 Lymphedema, not elsewhere classified: Secondary | ICD-10-CM | POA: Diagnosis not present

## 2016-10-13 DIAGNOSIS — I83022 Varicose veins of left lower extremity with ulcer of calf: Secondary | ICD-10-CM | POA: Diagnosis not present

## 2016-10-13 DIAGNOSIS — L97222 Non-pressure chronic ulcer of left calf with fat layer exposed: Secondary | ICD-10-CM | POA: Diagnosis not present

## 2016-10-13 DIAGNOSIS — L97221 Non-pressure chronic ulcer of left calf limited to breakdown of skin: Secondary | ICD-10-CM | POA: Diagnosis not present

## 2016-10-13 DIAGNOSIS — J449 Chronic obstructive pulmonary disease, unspecified: Secondary | ICD-10-CM | POA: Diagnosis not present

## 2016-10-13 DIAGNOSIS — I10 Essential (primary) hypertension: Secondary | ICD-10-CM | POA: Diagnosis not present

## 2016-10-13 DIAGNOSIS — S81802A Unspecified open wound, left lower leg, initial encounter: Secondary | ICD-10-CM | POA: Diagnosis not present

## 2016-10-21 ENCOUNTER — Encounter (HOSPITAL_BASED_OUTPATIENT_CLINIC_OR_DEPARTMENT_OTHER): Payer: Medicare Other | Attending: Surgery

## 2016-10-21 DIAGNOSIS — Z09 Encounter for follow-up examination after completed treatment for conditions other than malignant neoplasm: Secondary | ICD-10-CM | POA: Diagnosis not present

## 2016-10-21 DIAGNOSIS — I10 Essential (primary) hypertension: Secondary | ICD-10-CM | POA: Insufficient documentation

## 2016-10-21 DIAGNOSIS — I8392 Asymptomatic varicose veins of left lower extremity: Secondary | ICD-10-CM | POA: Diagnosis not present

## 2016-10-21 DIAGNOSIS — I890 Lymphedema, not elsewhere classified: Secondary | ICD-10-CM | POA: Insufficient documentation

## 2016-10-21 DIAGNOSIS — J449 Chronic obstructive pulmonary disease, unspecified: Secondary | ICD-10-CM | POA: Insufficient documentation

## 2016-10-21 DIAGNOSIS — Z872 Personal history of diseases of the skin and subcutaneous tissue: Secondary | ICD-10-CM | POA: Insufficient documentation

## 2016-10-21 DIAGNOSIS — L97219 Non-pressure chronic ulcer of right calf with unspecified severity: Secondary | ICD-10-CM | POA: Diagnosis not present

## 2016-10-22 IMAGING — CR DG KNEE 1-2V*R*
2 series · 2 of 2 positions shown · non-contrast
Comparison: None.

CLINICAL DATA: Fall.  Right knee pain and swelling.

EXAM:
RIGHT KNEE - 1-2 VIEW

[view not recorded (1 of 2)]
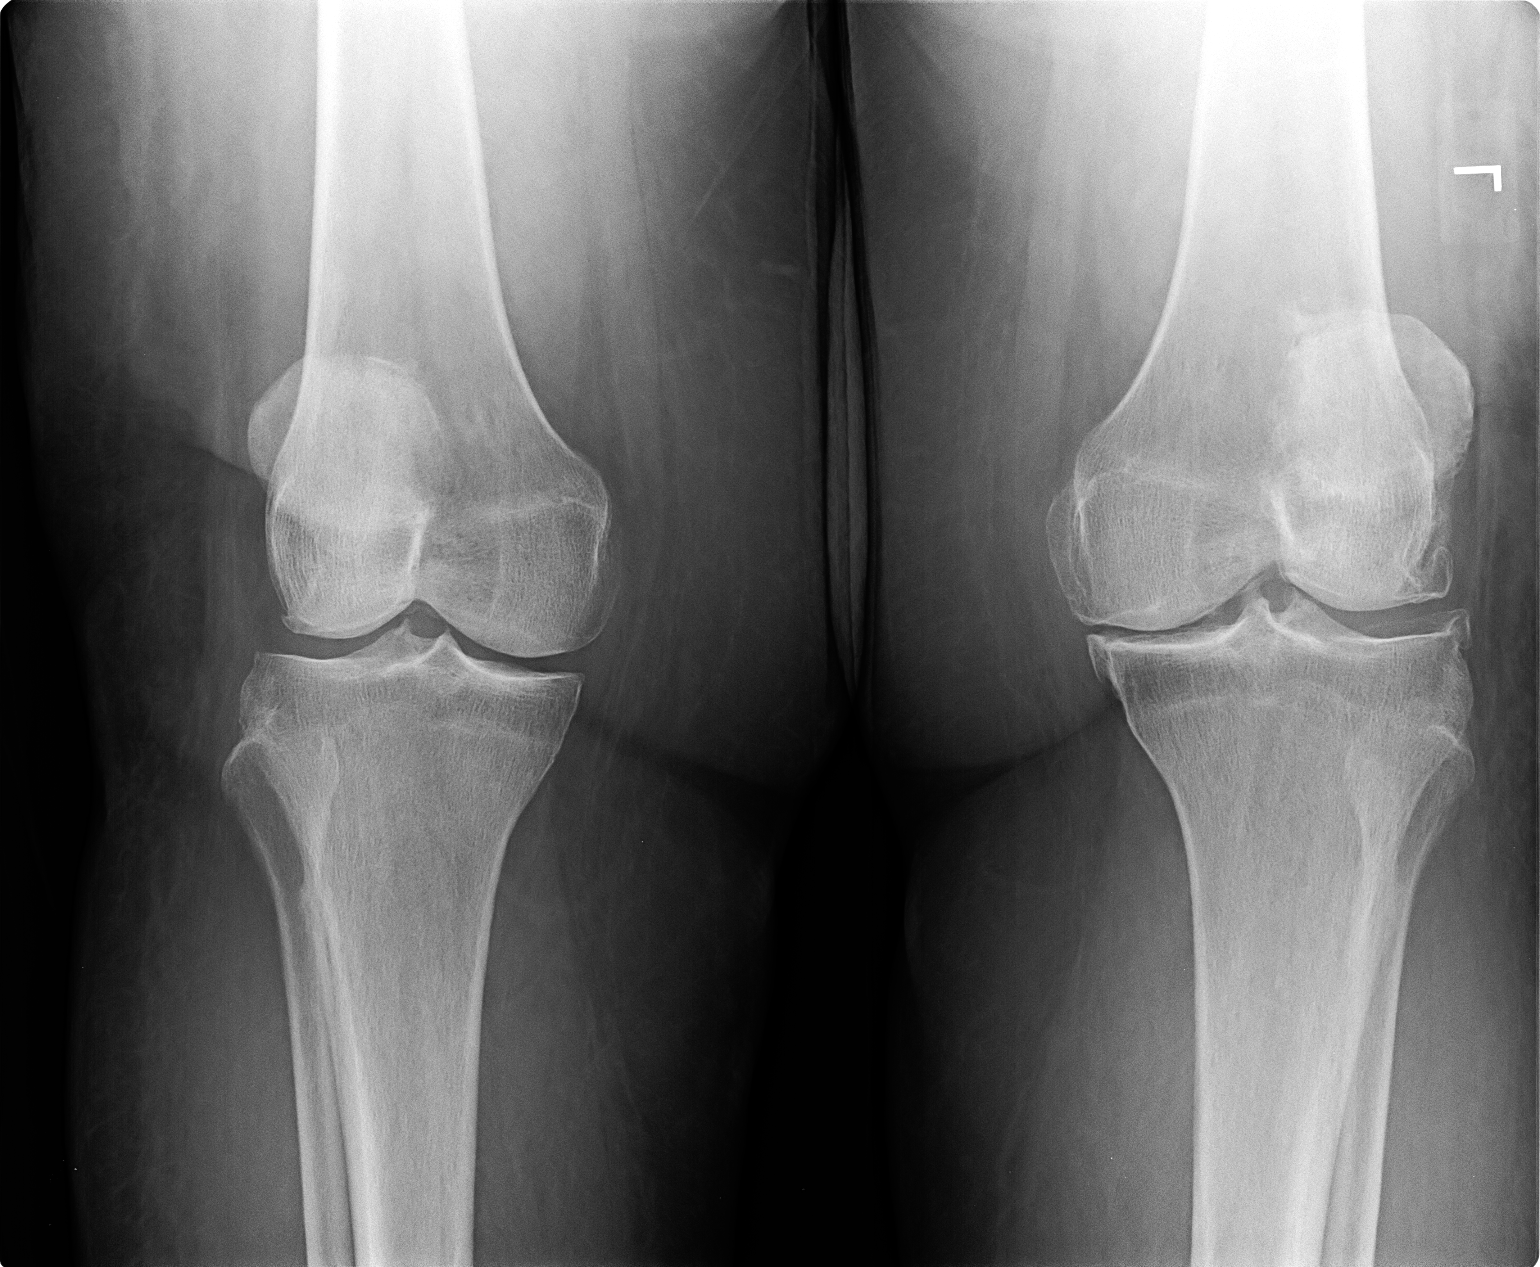

[view not recorded (2 of 2)]
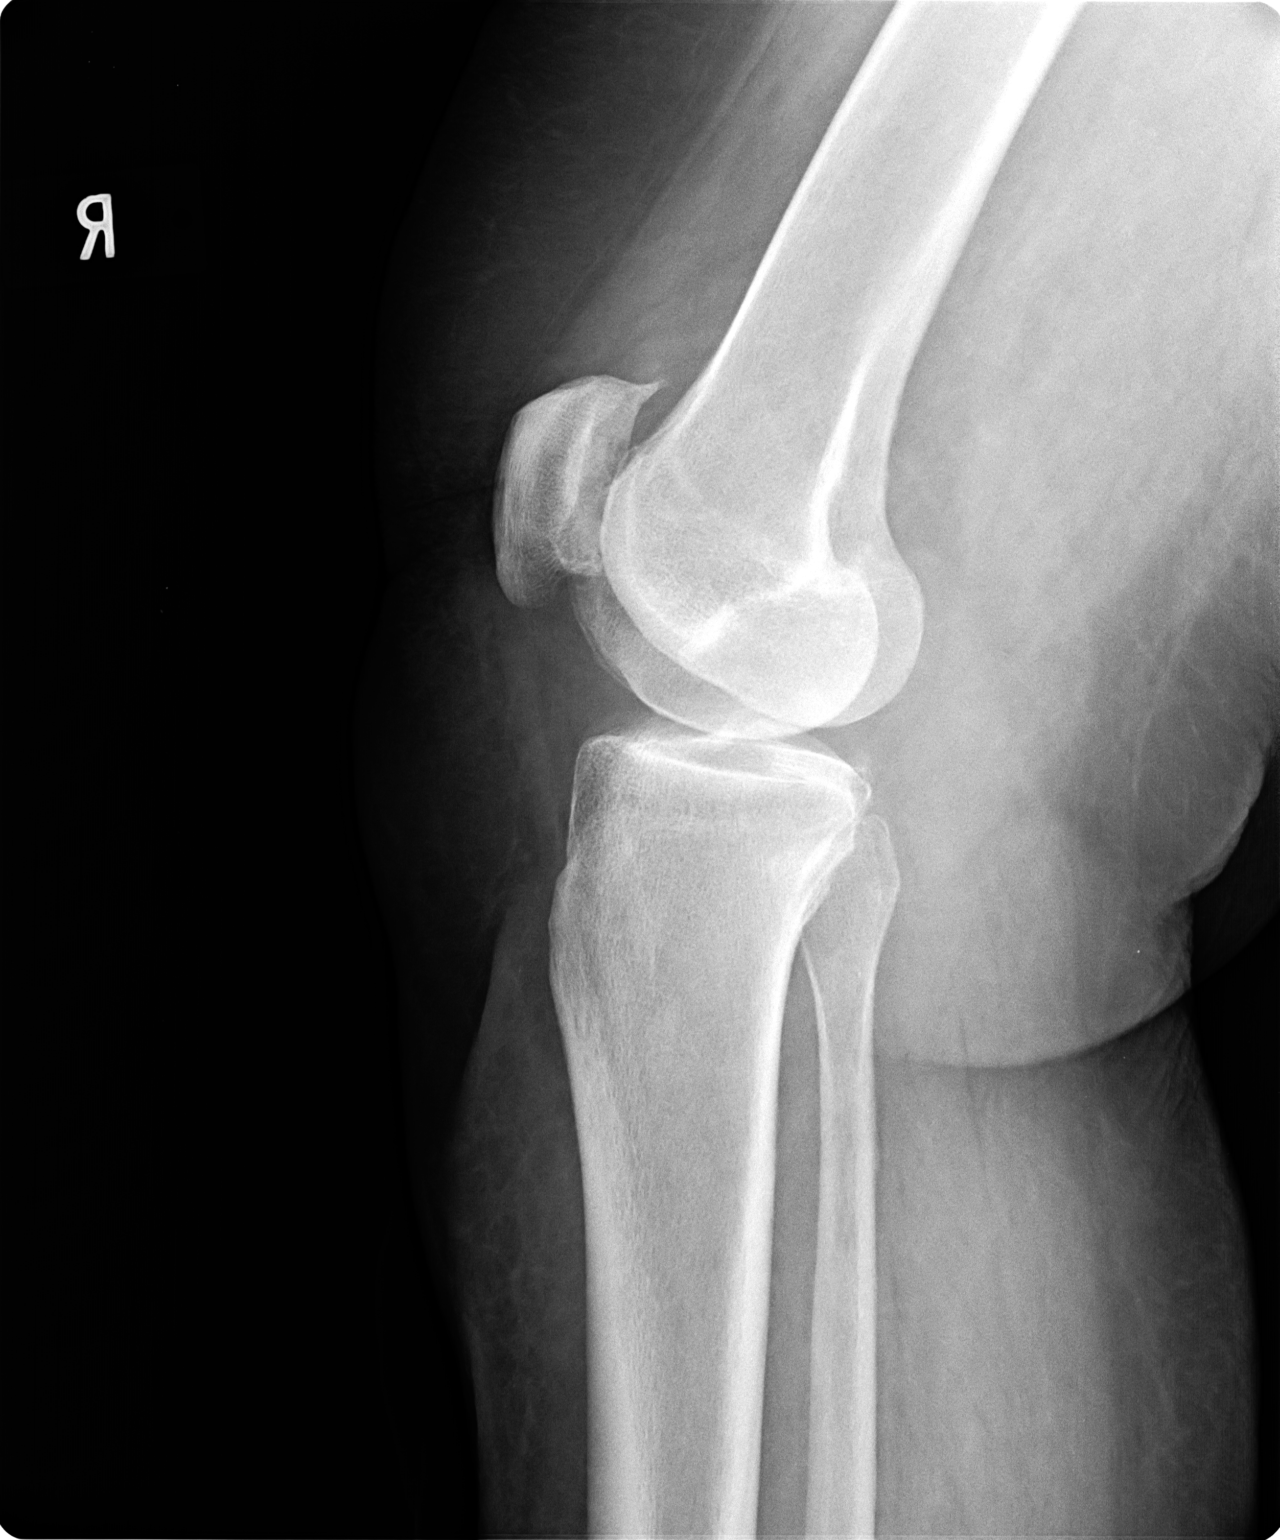

[2 of 2 positions shown; findings below may reference images not displayed]

FINDINGS: Small right knee joint effusion cannot be excluded. Tricompartment
degenerative change present. Degenerative changes most prominent
about the patellofemoral compartment. Prominent tricompartment
degenerative changes are present about the left knee. Deformity of
the tibial plateau is most likely degenerative. A left knee series
can be obtained to further evaluate as needed . Bilateral,
chondrocalcinosis most likely degenerative noted.
IMPRESSION: Prominent degenerative changes both knees, particularly the left. No
acute abnormality identified. No evidence of fracture or dislocation
about the right knee.

## 2016-11-04 DIAGNOSIS — B351 Tinea unguium: Secondary | ICD-10-CM | POA: Diagnosis not present

## 2016-11-04 DIAGNOSIS — M79671 Pain in right foot: Secondary | ICD-10-CM | POA: Diagnosis not present

## 2016-11-04 DIAGNOSIS — I739 Peripheral vascular disease, unspecified: Secondary | ICD-10-CM | POA: Diagnosis not present

## 2016-11-04 DIAGNOSIS — M79672 Pain in left foot: Secondary | ICD-10-CM | POA: Diagnosis not present

## 2016-12-02 ENCOUNTER — Encounter: Payer: Self-pay | Admitting: Family Medicine

## 2016-12-02 ENCOUNTER — Ambulatory Visit (INDEPENDENT_AMBULATORY_CARE_PROVIDER_SITE_OTHER): Payer: Medicare Other | Admitting: Family Medicine

## 2016-12-02 VITALS — BP 110/64 | HR 94 | Temp 98.1°F | Ht 64.0 in | Wt 228.0 lb

## 2016-12-02 DIAGNOSIS — E782 Mixed hyperlipidemia: Secondary | ICD-10-CM | POA: Diagnosis not present

## 2016-12-02 DIAGNOSIS — I10 Essential (primary) hypertension: Secondary | ICD-10-CM

## 2016-12-02 DIAGNOSIS — Z6839 Body mass index (BMI) 39.0-39.9, adult: Secondary | ICD-10-CM | POA: Diagnosis not present

## 2016-12-02 DIAGNOSIS — E039 Hypothyroidism, unspecified: Secondary | ICD-10-CM | POA: Diagnosis not present

## 2016-12-02 DIAGNOSIS — K2100 Gastro-esophageal reflux disease with esophagitis, without bleeding: Secondary | ICD-10-CM

## 2016-12-02 DIAGNOSIS — J439 Emphysema, unspecified: Secondary | ICD-10-CM | POA: Diagnosis not present

## 2016-12-02 DIAGNOSIS — I872 Venous insufficiency (chronic) (peripheral): Secondary | ICD-10-CM | POA: Diagnosis not present

## 2016-12-02 MED ORDER — NYSTATIN 100000 UNIT/GM EX CREA
1.0000 | TOPICAL_CREAM | Freq: Two times a day (BID) | CUTANEOUS | 2 refills | Status: DC
Start: 2016-12-02 — End: 2017-05-17

## 2016-12-02 MED ORDER — MUPIROCIN 2 % EX OINT: 1.0000 "application " | TOPICAL_OINTMENT | Freq: Three times a day (TID) | CUTANEOUS | 6 refills | Status: AC

## 2016-12-02 MED ORDER — SIMVASTATIN 40 MG PO TABS: 40.0000 mg | ORAL_TABLET | Freq: Every day | ORAL | 1 refills | Status: AC

## 2016-12-02 MED ORDER — NORTRIPTYLINE HCL 75 MG PO CAPS: 75.0000 mg | ORAL_CAPSULE | Freq: Every day | ORAL | 1 refills | Status: DC

## 2016-12-02 MED ORDER — FUROSEMIDE 40 MG PO TABS: 40.0000 mg | ORAL_TABLET | Freq: Every day | ORAL | 1 refills | Status: AC

## 2016-12-02 MED ORDER — METOLAZONE 2.5 MG PO TABS: ORAL_TABLET | ORAL | 3 refills | Status: AC

## 2016-12-02 MED ORDER — LISINOPRIL-HYDROCHLOROTHIAZIDE 10-12.5 MG PO TABS: 1.0000 | ORAL_TABLET | Freq: Every day | ORAL | 1 refills | Status: AC

## 2016-12-02 NOTE — Progress Notes (Signed)
BP 110/64   Pulse 94   Temp 98.1 F (36.7 C) (Oral)   Ht _0  (1.626 m)   Wt 228 lb (103.4 kg)   BMI 39.14 kg/m    Subjective:    Patient ID: Betty Keller, female    DOB: 28-Mar-1936, 81 y.o.   MRN: [id number]  HPI: Betty Keller is a 81 y.o. female presenting on 12/02/2016 for COPD (3 month followup); Hypertension; Hypothyroidism; Hyperlipidemia; and Gastroesophageal Reflux (patient feels that Ranitidine is not helping anymore)   HPI Hypertension Patient is currently on Lisinopril-hydrochlorothiazide and metolazone, and her blood pressure today is 110/64. Patient denies any lightheadedness or dizziness. Patient denies headaches, blurred vision, chest pains, shortness of breath, or weakness. Denies any side effects from medication and is content with current medication.   Hypothyroidism recheck Patient is coming in for thyroid recheck today as well. He denies any issues with hair changes or heat or cold problems or diarrhea or constipation. He denies any chest pain or palpitations. She is not currently on medication as this was a new diagnosis, we will recheck the levels.  Hyperlipidemia Patient is coming in for recheck of his hyperlipidemia. He is currently taking simvastatin 40 mg. He denies any issues with myalgias or history of liver damage from it. He denies any focal numbness or weakness or chest pain.    COPD recheck Patient has been doing well with her breathing and denies any major issues with it. She has not been using her Symbicort and does not feel like she needs it. She denies any coughing or wheezing episodes.  Obesity and swelling in legs Patient comes in today with complaints of swelling in her legs and she no she has chronic obesity and that it's likely a factor in this. She is on diuretics for this and has been using a wrap on her left leg that she likes a lot to keep the swelling down that was given to her for wound care. She would like to see if we can get her  one for her other leg. She denies any redness or warmth or fevers or chills are swelling more than what she has had but is just the chronic swelling in both legs that causes her skin is stretching crack. She denies any open wounds currently.  GERD Patient complains that her ranitidine is not working as well as it was would like to try something different. She denies any blood in her stool or abdominal pain or nausea or vomiting but she does get a lot of heartburn and acid reflux.  Relevant past medical, surgical, family and social history reviewed and updated as indicated. Interim medical history since our last visit reviewed. Allergies and medications reviewed and updated.  Review of Systems  Constitutional: Negative for chills and fever.  HENT: Negative for congestion, ear discharge and ear pain.   Eyes: Negative for redness and visual disturbance.  Respiratory: Negative for chest tightness, shortness of breath and wheezing.   Cardiovascular: Positive for leg swelling. Negative for chest pain.  Gastrointestinal: Negative for abdominal pain, anal bleeding, blood in stool, constipation, diarrhea, nausea, rectal pain and vomiting.  Genitourinary: Negative for difficulty urinating and dysuria.  Musculoskeletal: Negative for back pain and gait problem.  Skin: Negative for rash.  Neurological: Negative for dizziness, light-headedness and headaches.  Psychiatric/Behavioral: Negative for agitation and behavioral problems.  All other systems reviewed and are negative.   Per HPI unless specifically indicated above  Objective:    BP 110/64   Pulse 94   Temp 98.1 F (36.7 C) (Oral)   Ht _0  (1.626 m)   Wt 228 lb (103.4 kg)   BMI 39.14 kg/m   Wt Readings from Last 3 Encounters:  12/02/16 228 lb (103.4 kg)  05/28/16 228 lb (103.4 kg)  04/08/16 222 lb 6.4 oz (100.9 kg)    Physical Exam  Constitutional: She is oriented to person, place, and time. She appears well-developed and  well-nourished. No distress.  Eyes: Conjunctivae are normal.  Neck: Neck supple. No thyromegaly present.  Cardiovascular: Normal rate, regular rhythm, normal heart sounds and intact distal pulses.   No murmur heard. Pulmonary/Chest: Effort normal and breath sounds normal. No respiratory distress. She has no wheezes.  Abdominal: Soft. Bowel sounds are normal. She exhibits no distension. There is no tenderness. There is no rebound and no guarding.  Morbidly obese  Musculoskeletal: Normal range of motion. She exhibits edema (4+ edema, 2+ pitting, chronic edematous changes and cracking of the outer layer of the skin. No open wounds noted on both bilateral lower extremities).  Lymphadenopathy:    She has no cervical adenopathy.  Neurological: She is alert and oriented to person, place, and time. Coordination normal.  Skin: Skin is warm and dry. No rash noted. She is not diaphoretic.  Psychiatric: She has a normal mood and affect. Her behavior is normal.  Nursing note and vitals reviewed.     Assessment & Plan:   Problem List Items Addressed This Visit      Cardiovascular and Mediastinum   HTN (hypertension)   Relevant Medications   simvastatin (ZOCOR) 40 MG tablet   metolazone (ZAROXOLYN) 2.5 MG tablet   lisinopril-hydrochlorothiazide (PRINZIDE,ZESTORETIC) 10-12.5 MG tablet   furosemide (LASIX) 40 MG tablet   Other Relevant Orders   CMP14+EGFR   Chronic venous insufficiency   Relevant Medications   simvastatin (ZOCOR) 40 MG tablet   metolazone (ZAROXOLYN) 2.5 MG tablet   lisinopril-hydrochlorothiazide (PRINZIDE,ZESTORETIC) 10-12.5 MG tablet   furosemide (LASIX) 40 MG tablet   Other Relevant Orders   DME Other see comment     Respiratory   COPD (chronic obstructive pulmonary disease) (HCC) - Primary     Digestive   GERD (gastroesophageal reflux disease)     Endocrine   Hypothyroid   Relevant Orders   TSH     Other   Obesity   Hyperlipidemia   Relevant Medications    simvastatin (ZOCOR) 40 MG tablet   metolazone (ZAROXOLYN) 2.5 MG tablet   lisinopril-hydrochlorothiazide (PRINZIDE,ZESTORETIC) 10-12.5 MG tablet   furosemide (LASIX) 40 MG tablet   Other Relevant Orders   Lipid panel       Follow up plan: Return in about 3 months (around 03/04/2017), or if symptoms worsen or fail to improve, for Follow-up in 3 months.  Counseling provided for all of the vaccine components Orders Placed This Encounter  Procedures  . DME Other see comment  . CMP14+EGFR  . Lipid panel  . TSH    Caryl Pina, MD [facility] 12/02/2016, 2:18 PM

## 2016-12-03 LAB — LIPID PANEL
CHOLESTEROL TOTAL: 160 mg/dL (ref 100–199)
Chol/HDL Ratio: 2.9 ratio (ref 0.0–4.4)
HDL: 55 mg/dL (ref >39)
LDL Calculated: 92 mg/dL (ref 0–99)
TRIGLYCERIDES: 66 mg/dL (ref 0–149)
VLDL CHOLESTEROL CAL: 13 mg/dL (ref 5–40)

## 2016-12-03 LAB — CMP14+EGFR
A/G RATIO: 1.1 — AB (ref 1.2–2.2)
ALK PHOS: 124 IU/L — AB (ref 39–117)
ALT: 23 IU/L (ref 0–32)
AST: 24 IU/L (ref 0–40)
Albumin: 3.6 g/dL (ref 3.5–4.8)
BILIRUBIN TOTAL: 0.2 mg/dL (ref 0.0–1.2)
BUN/Creatinine Ratio: 19 (ref 12–28)
BUN: 20 mg/dL (ref 8–27)
CHLORIDE: 101 mmol/L (ref 96–106)
CO2: 27 mmol/L (ref 18–29)
Calcium: 9 mg/dL (ref 8.7–10.3)
Creatinine, Ser: 1.06 mg/dL — ABNORMAL HIGH (ref 0.57–1.00)
GFR calc Af Amer: 58 mL/min/{1.73_m2} — ABNORMAL LOW (ref >59)
GFR calc non Af Amer: 50 mL/min/{1.73_m2} — ABNORMAL LOW (ref >59)
Globulin, Total: 3.2 g/dL (ref 1.5–4.5)
Glucose: 92 mg/dL (ref 65–99)
POTASSIUM: 4.4 mmol/L (ref 3.5–5.2)
Sodium: 143 mmol/L (ref 134–144)
TOTAL PROTEIN: 6.8 g/dL (ref 6.0–8.5)

## 2016-12-03 LAB — TSH: TSH: 3.2 u[IU]/mL (ref 0.450–4.500)

## 2016-12-15 ENCOUNTER — Encounter: Payer: Self-pay | Admitting: Family Medicine

## 2016-12-15 ENCOUNTER — Ambulatory Visit (INDEPENDENT_AMBULATORY_CARE_PROVIDER_SITE_OTHER): Payer: Medicare Other | Admitting: Family Medicine

## 2016-12-15 VITALS — BP 118/73 | HR 88 | Temp 98.4°F

## 2016-12-15 DIAGNOSIS — I890 Lymphedema, not elsewhere classified: Secondary | ICD-10-CM | POA: Diagnosis not present

## 2016-12-15 DIAGNOSIS — Z6839 Body mass index (BMI) 39.0-39.9, adult: Secondary | ICD-10-CM | POA: Diagnosis not present

## 2016-12-15 MED ORDER — NALTREXONE-BUPROPION HCL ER 8-90 MG PO TB12: 2.0000 | ORAL_TABLET | Freq: Two times a day (BID) | ORAL | 2 refills | Status: AC

## 2016-12-15 MED ORDER — NALTREXONE-BUPROPION HCL ER 8-90 MG PO TB12: ORAL_TABLET | ORAL | 0 refills | Status: DC

## 2016-12-17 ENCOUNTER — Telehealth: Payer: Self-pay

## 2016-12-17 ENCOUNTER — Telehealth: Payer: Self-pay | Admitting: Family Medicine

## 2016-12-17 DIAGNOSIS — I890 Lymphedema, not elsewhere classified: Secondary | ICD-10-CM

## 2016-12-17 NOTE — Progress Notes (Signed)
BP 118/73   Pulse 88   Temp 98.4 F (36.9 C) (Oral)    Subjective:    Patient ID: Betty Keller, female    DOB: Mar 01, 1936, 81 y.o.   MRN: [id number]  HPI: Betty Keller is a 81 y.o. female presenting on 12/15/2016 for Discuss weight loss strategies (would like to discuss medication)   HPI Obesity, wants to discuss weight loss Patient comes in today because she wants to discuss weight loss options and dietary pills. She does not want to back off on what she eats but wants to take a pill to help her lose weight. We discussed possible options that could work for her with minimal side effects and also discussed decreasing intake and increasing outtake and possibly even going to pool where she could work out on a more regular basis. She admits that those would be challenging things and does not act like she is going to do any of those specific things. We also discussed the fact that she does have the chronic venous insufficiency and lymphedema which is likely where a lot of her extra weight has collected. We discussed possibly going to a lymphedema clinic and she will look into possible transportation for that.  Relevant past medical, surgical, family and social history reviewed and updated as indicated. Interim medical history since our last visit reviewed. Allergies and medications reviewed and updated.  Review of Systems  Constitutional: Positive for unexpected weight change. Negative for chills and fever.  HENT: Negative for congestion, ear discharge and ear pain.   Eyes: Negative for redness and visual disturbance.  Respiratory: Negative for chest tightness and shortness of breath.   Cardiovascular: Positive for leg swelling. Negative for chest pain.  Gastrointestinal: Negative for abdominal pain, constipation, diarrhea, nausea and vomiting.  Musculoskeletal: Negative for back pain and gait problem.  Skin: Negative for rash.  Neurological: Negative for light-headedness and headaches.   Psychiatric/Behavioral: Negative for agitation and behavioral problems.  All other systems reviewed and are negative.   Per HPI unless specifically indicated above        Objective:    BP 118/73   Pulse 88   Temp 98.4 F (36.9 C) (Oral)   Wt Readings from Last 3 Encounters:  12/02/16 228 lb (103.4 kg)  05/28/16 228 lb (103.4 kg)  04/08/16 222 lb 6.4 oz (100.9 kg)    Physical Exam  Constitutional: She is oriented to person, place, and time. She appears well-developed and well-nourished. No distress.  Eyes: Conjunctivae are normal.  Neck: Neck supple.  Cardiovascular: Normal rate, regular rhythm, normal heart sounds and intact distal pulses.   No murmur heard. Pulmonary/Chest: Effort normal and breath sounds normal. No respiratory distress. She has no wheezes. She has no rales.  Musculoskeletal: Normal range of motion. She exhibits edema (3+ lower extremity edema bilaterally).  Lymphadenopathy:    She has no cervical adenopathy.  Neurological: She is alert and oriented to person, place, and time. Coordination normal.  Skin: Skin is warm and dry. No rash noted. She is not diaphoretic.  Psychiatric: She has a normal mood and affect. Her behavior is normal.  Nursing note and vitals reviewed.       Assessment & Plan:   Problem List Items Addressed This Visit      Other   Obesity - Primary   Relevant Medications   Naltrexone-Bupropion HCl ER (CONTRAVE) 8-90 MG TB12   Naltrexone-Bupropion HCl ER (CONTRAVE) 8-90 MG TB12    Other Visit Diagnoses  Lymphedema of both lower extremities           Follow up plan: Return if symptoms worsen or fail to improve.  Counseling provided for all of the vaccine components No orders of the defined types were placed in this encounter.   Arville Care, MD [facility] 12/17/2016, 12:22 PM

## 2016-12-17 NOTE — Telephone Encounter (Signed)
Pt aware referral placed and she will get a phone call once appt has been made. Pt voiced understanding.

## 2016-12-18 MED ORDER — BUPROPION HCL ER (SR) 150 MG PO TB12: 150.0000 mg | ORAL_TABLET | Freq: Two times a day (BID) | ORAL | 1 refills | Status: AC

## 2016-12-18 NOTE — Telephone Encounter (Signed)
Pt aware we sent in Rx for Bupropion only

## 2016-12-24 ENCOUNTER — Ambulatory Visit (INDEPENDENT_AMBULATORY_CARE_PROVIDER_SITE_OTHER): Payer: Medicare Other

## 2016-12-24 ENCOUNTER — Other Ambulatory Visit: Payer: Self-pay | Admitting: Family Medicine

## 2016-12-24 ENCOUNTER — Encounter: Payer: Self-pay | Admitting: Family Medicine

## 2016-12-24 ENCOUNTER — Ambulatory Visit (INDEPENDENT_AMBULATORY_CARE_PROVIDER_SITE_OTHER): Payer: Medicare Other | Admitting: Family Medicine

## 2016-12-24 VITALS — BP 121/72 | HR 90 | Temp 98.2°F

## 2016-12-24 DIAGNOSIS — S0093XA Contusion of unspecified part of head, initial encounter: Secondary | ICD-10-CM

## 2016-12-24 DIAGNOSIS — S0083XA Contusion of other part of head, initial encounter: Secondary | ICD-10-CM

## 2016-12-24 DIAGNOSIS — J441 Chronic obstructive pulmonary disease with (acute) exacerbation: Secondary | ICD-10-CM | POA: Diagnosis not present

## 2016-12-24 MED ORDER — BENZONATATE 100 MG PO CAPS: 100.0000 mg | ORAL_CAPSULE | Freq: Two times a day (BID) | ORAL | 0 refills | Status: DC | PRN

## 2016-12-24 MED ORDER — METHYLPREDNISOLONE ACETATE 80 MG/ML IJ SUSP
80.0000 mg | Freq: Once | INTRAMUSCULAR | Status: AC
  Administered 2016-12-24: 80 mg via INTRAMUSCULAR

## 2016-12-24 MED ORDER — CEFDINIR 300 MG PO CAPS: 300.0000 mg | ORAL_CAPSULE | Freq: Two times a day (BID) | ORAL | 0 refills | Status: DC

## 2016-12-24 NOTE — Progress Notes (Signed)
BP 121/72   Pulse 90   Temp 98.2 F (36.8 C) (Oral)    Subjective:    Patient ID: Betty Keller, female    DOB: 1936/01/09, 81 y.o.   MRN: [id number]  HPI: Betty GunnelsBarbara A Sahli is a 81 y.o. female presenting on 12/24/2016 for Cough (x 1 week, productive); Chest congestion; and Fall this morning (scrape on right elbow, hit back of head, no loss of consciousness)   HPI Cough and chest congestion Patient has been having cough and chest congestion and sinus pressure and drainage that's been going on for about a week. She has a productive cough of yellow-green sputum. She feels like it is not improving. She has tried Tylenol Sinus and cold and Advil without much success. She says she that she has had some wheezing but denies any shortness of breath. She denies any fevers or chills. She denies any sick contacts that she knows of. She does feels like it's not improving and the cough is keeping her up at night sometimes.  Fall and contusion on the back of her head. Patient fell about 5 or 6 hours ago and hit the back of her head against the side of the bar in the kitchen. She says she just slipped and got a little off balance but has been having some similar issues in the past. They are trying to get her strength back but are also trying to get her to be more cautious. She has contusion on the back right side of her head. She denies any numbness or weakness focally. She denies any visual issues or changes in taste or smell. She ambulated in here normally for her without any increase issues.  Relevant past medical, surgical, family and social history reviewed and updated as indicated. Interim medical history since our last visit reviewed. Allergies and medications reviewed and updated.  Review of Systems  Constitutional: Negative for chills and fever.  HENT: Positive for congestion, postnasal drip, rhinorrhea, sinus pressure, sneezing and sore throat. Negative for ear discharge and ear pain.   Eyes:  Negative for pain, redness and visual disturbance.  Respiratory: Positive for cough and wheezing. Negative for chest tightness and shortness of breath.   Cardiovascular: Negative for chest pain and leg swelling.  Genitourinary: Negative for difficulty urinating and dysuria.  Musculoskeletal: Negative for back pain and gait problem.  Skin: Negative for color change and rash.  Neurological: Positive for headaches. Negative for light-headedness.  Psychiatric/Behavioral: Negative for agitation and behavioral problems.  All other systems reviewed and are negative.   Per HPI unless specifically indicated above   X-ray skull: no fracture or bleeding intracranially     Objective:    BP 121/72   Pulse 90   Temp 98.2 F (36.8 C) (Oral)   Wt Readings from Last 3 Encounters:  12/02/16 228 lb (103.4 kg)  05/28/16 228 lb (103.4 kg)  04/08/16 222 lb 6.4 oz (100.9 kg)    Physical Exam  Constitutional: She is oriented to person, place, and time. She appears well-developed and well-nourished. No distress.  HENT:  Head:    Right Ear: Tympanic membrane, external ear and ear canal normal.  Left Ear: Tympanic membrane, external ear and ear canal normal.  Nose: Mucosal edema and rhinorrhea present. No epistaxis. Right sinus exhibits no maxillary sinus tenderness and no frontal sinus tenderness. Left sinus exhibits no maxillary sinus tenderness and no frontal sinus tenderness.  Mouth/Throat: Uvula is midline and mucous membranes are normal. Posterior oropharyngeal edema  and posterior oropharyngeal erythema present. No oropharyngeal exudate or tonsillar abscesses.  Eyes: Conjunctivae and EOM are normal.  Cardiovascular: Normal rate, regular rhythm, normal heart sounds and intact distal pulses.   No murmur heard. Pulmonary/Chest: Effort normal and breath sounds normal. No respiratory distress. She has no wheezes.  Musculoskeletal: Normal range of motion. She exhibits no edema or tenderness.    Neurological: She is alert and oriented to person, place, and time. She has normal strength. No cranial nerve deficit or sensory deficit. Coordination normal.  Skin: Skin is warm and dry. No rash noted. She is not diaphoretic.  Psychiatric: She has a normal mood and affect. Her behavior is normal.  Nursing note and vitals reviewed.     Assessment & Plan:   Problem List Items Addressed This Visit    None    Visit Diagnoses    COPD exacerbation (HCC)    -  Primary   Relevant Medications   methylPREDNISolone acetate (DEPO-MEDROL) injection 80 mg (Start on 12/24/2016 12:30 PM)   cefdinir (OMNICEF) 300 MG capsule   benzonatate (TESSALON PERLES) 100 MG capsule   Contusion of other part of head, initial encounter       Right occiput, will do x-ray make sure there is no bleed, patient neurologically checks up       Follow up plan: Return if symptoms worsen or fail to improve.  Counseling provided for all of the vaccine components No orders of the defined types were placed in this encounter.   Arville Care, MD [facility] 12/24/2016, 12:24 PM

## 2016-12-28 DIAGNOSIS — I890 Lymphedema, not elsewhere classified: Secondary | ICD-10-CM | POA: Diagnosis not present

## 2016-12-31 DIAGNOSIS — I890 Lymphedema, not elsewhere classified: Secondary | ICD-10-CM | POA: Diagnosis not present

## 2017-01-04 DIAGNOSIS — I890 Lymphedema, not elsewhere classified: Secondary | ICD-10-CM | POA: Diagnosis not present

## 2017-01-06 ENCOUNTER — Ambulatory Visit (INDEPENDENT_AMBULATORY_CARE_PROVIDER_SITE_OTHER): Payer: Medicare Other | Admitting: Family Medicine

## 2017-01-06 ENCOUNTER — Ambulatory Visit (INDEPENDENT_AMBULATORY_CARE_PROVIDER_SITE_OTHER): Payer: Medicare Other

## 2017-01-06 ENCOUNTER — Encounter: Payer: Self-pay | Admitting: Family Medicine

## 2017-01-06 ENCOUNTER — Telehealth: Payer: Self-pay | Admitting: Family Medicine

## 2017-01-06 VITALS — BP 138/76 | HR 83 | Temp 98.4°F | Ht 64.0 in | Wt 221.0 lb

## 2017-01-06 DIAGNOSIS — J441 Chronic obstructive pulmonary disease with (acute) exacerbation: Secondary | ICD-10-CM

## 2017-01-06 DIAGNOSIS — R0602 Shortness of breath: Secondary | ICD-10-CM

## 2017-01-06 MED ORDER — PREDNISONE 20 MG PO TABS: ORAL_TABLET | ORAL | 0 refills | Status: DC

## 2017-01-06 MED ORDER — AMOXICILLIN-POT CLAVULANATE 875-125 MG PO TABS: 1.0000 | ORAL_TABLET | Freq: Two times a day (BID) | ORAL | 0 refills | Status: DC

## 2017-01-06 MED ORDER — TRIAMCINOLONE ACETONIDE 0.1 % EX CREA: 1.0000 "application " | TOPICAL_CREAM | Freq: Two times a day (BID) | CUTANEOUS | 0 refills | Status: AC

## 2017-01-06 NOTE — Telephone Encounter (Signed)
Please review and route to pool a 

## 2017-01-06 NOTE — Progress Notes (Signed)
BP 138/76   Pulse 83   Temp 98.4 F (36.9 C) (Oral)   Ht 5\' 4"  (1.626 m)   Wt 221 lb (100.2 kg)   BMI 37.93 kg/m    Subjective:    Patient ID: Betty Keller, female    DOB: 04-18-1936, 81 y.o.   MRN: [id number]  HPI: Betty Keller is a 81 y.o. female presenting on 01/06/2017 for Cough (2nd visit for this)   HPI Cough and chest congestion and wheezing Patient has been having cough and chest congestion and wheezing is been going on for the past couple weeks. She was seen previously about a week ago and was treated with steroids and Omnicef and given a cough medicine which she said did nothing for her at all. She says the cough is persistent and the wheezing and short of breath have persisted about the same.she says that she is short of breath at times and wheezes frequently.  Relevant past medical, surgical, family and social history reviewed and updated as indicated. Interim medical history since our last visit reviewed. Allergies and medications reviewed and updated.  Review of Systems  Constitutional: Negative for chills and fever.  HENT: Positive for congestion, postnasal drip and rhinorrhea. Negative for ear discharge, ear pain, sinus pressure, sneezing and sore throat.   Eyes: Negative for pain, redness and visual disturbance.  Respiratory: Positive for cough, shortness of breath and wheezing. Negative for chest tightness.   Cardiovascular: Negative for chest pain and leg swelling.  Genitourinary: Negative for difficulty urinating and dysuria.  Musculoskeletal: Negative for back pain and gait problem.  Skin: Negative for rash.  Neurological: Negative for light-headedness and headaches.  Psychiatric/Behavioral: Negative for agitation and behavioral problems.  All other systems reviewed and are negative.   Per HPI unless specifically indicated above        Objective:    BP 138/76   Pulse 83   Temp 98.4 F (36.9 C) (Oral)   Ht 5\' 4"  (1.626 m)   Wt 221 lb (100.2  kg)   BMI 37.93 kg/m   Wt Readings from Last 3 Encounters:  01/06/17 221 lb (100.2 kg)  12/02/16 228 lb (103.4 kg)  05/28/16 228 lb (103.4 kg)    Physical Exam  Constitutional: She is oriented to person, place, and time. She appears well-developed and well-nourished. No distress.  HENT:  Right Ear: Tympanic membrane, external ear and ear canal normal.  Left Ear: Tympanic membrane, external ear and ear canal normal.  Nose: Mucosal edema and rhinorrhea present. No epistaxis. Right sinus exhibits no maxillary sinus tenderness and no frontal sinus tenderness. Left sinus exhibits no maxillary sinus tenderness and no frontal sinus tenderness.  Mouth/Throat: Uvula is midline and mucous membranes are normal. Posterior oropharyngeal edema and posterior oropharyngeal erythema present. No oropharyngeal exudate or tonsillar abscesses.  Eyes: Conjunctivae and EOM are normal.  Cardiovascular: Normal rate, regular rhythm, normal heart sounds and intact distal pulses.   No murmur heard. Pulmonary/Chest: Effort normal. No respiratory distress. She has decreased breath sounds (Decreased breath sounds in both lower lobes, worse on right). She has wheezes (End expiratory wheezes in upper lobes). She has rhonchi (Throughout). She has no rales.  Musculoskeletal: Normal range of motion. She exhibits no edema or tenderness.  Neurological: She is alert and oriented to person, place, and time. Coordination normal.  Skin: Skin is warm and dry. No rash noted. She is not diaphoretic.  Psychiatric: She has a normal mood and affect. Her behavior is  normal.  Vitals reviewed.   Chest x-ray: Hyperinflated right lung, has been a previous, no focal consolidation, signs of atelectasis and hypervascularity. Await final read from radiologist    Assessment & Plan:   Problem List Items Addressed This Visit    None    Visit Diagnoses    Shortness of breath    -  Primary   Relevant Orders   DG Chest 2 View       Follow  up plan: Return if symptoms worsen or fail to improve.  Counseling provided for all of the vaccine components Orders Placed This Encounter  Procedures  . DG Chest 2 View    Arville Care, MD [facility] 01/06/2017, 4:33 PM

## 2017-01-06 NOTE — Telephone Encounter (Signed)
Pt aware NTBS She will get with her family and call back for an appointment

## 2017-01-06 NOTE — Telephone Encounter (Signed)
If she is no better then she will need to be seen again, we need to figure out why she is not better

## 2017-01-07 DIAGNOSIS — I890 Lymphedema, not elsewhere classified: Secondary | ICD-10-CM | POA: Diagnosis not present

## 2017-01-11 DIAGNOSIS — I890 Lymphedema, not elsewhere classified: Secondary | ICD-10-CM | POA: Diagnosis not present

## 2017-01-14 DIAGNOSIS — I890 Lymphedema, not elsewhere classified: Secondary | ICD-10-CM | POA: Diagnosis not present

## 2017-01-18 DIAGNOSIS — I890 Lymphedema, not elsewhere classified: Secondary | ICD-10-CM | POA: Diagnosis not present

## 2017-01-19 DIAGNOSIS — I890 Lymphedema, not elsewhere classified: Secondary | ICD-10-CM | POA: Diagnosis not present

## 2017-01-25 DIAGNOSIS — I890 Lymphedema, not elsewhere classified: Secondary | ICD-10-CM | POA: Diagnosis not present

## 2017-01-27 DIAGNOSIS — I890 Lymphedema, not elsewhere classified: Secondary | ICD-10-CM | POA: Diagnosis not present

## 2017-01-28 ENCOUNTER — Ambulatory Visit (INDEPENDENT_AMBULATORY_CARE_PROVIDER_SITE_OTHER): Payer: Medicare Other | Admitting: Physician Assistant

## 2017-01-28 ENCOUNTER — Encounter: Payer: Self-pay | Admitting: Physician Assistant

## 2017-01-28 VITALS — BP 121/74 | HR 81 | Temp 98.2°F | Ht 64.0 in | Wt 222.0 lb

## 2017-01-28 DIAGNOSIS — I890 Lymphedema, not elsewhere classified: Secondary | ICD-10-CM

## 2017-01-28 DIAGNOSIS — L03115 Cellulitis of right lower limb: Secondary | ICD-10-CM

## 2017-01-28 MED ORDER — SILVER SULFADIAZINE 1 % EX CREA: 1.0000 "application " | TOPICAL_CREAM | Freq: Every day | CUTANEOUS | 0 refills | Status: AC

## 2017-01-28 MED ORDER — CLINDAMYCIN HCL 150 MG PO CAPS: 150.0000 mg | ORAL_CAPSULE | Freq: Three times a day (TID) | ORAL | 0 refills | Status: DC

## 2017-01-29 DIAGNOSIS — I890 Lymphedema, not elsewhere classified: Secondary | ICD-10-CM | POA: Insufficient documentation

## 2017-01-29 DIAGNOSIS — L03115 Cellulitis of right lower limb: Secondary | ICD-10-CM | POA: Insufficient documentation

## 2017-01-29 NOTE — Progress Notes (Signed)
BP 121/74   Pulse 81   Temp 98.2 F (36.8 C) (Oral)   Ht _0  (1.626 m)   Wt 222 lb (100.7 kg)   BMI 38.11 kg/m    Subjective:    Patient ID: Betty Keller, female    DOB: 07/09/1936, 81 y.o.   MRN: [id number]  HPI: Betty Keller is a 81 y.o. female presenting on 01/28/2017 for Cellulitis (right, found during PT yesterday, blister on foot redness )  When patient was seen at her lymphedema clinic yesterday the nurse told her that she had a very small ulcer on the medial right foot. She had also had increasing redness of her legs from the knee down. She has had recurrent cellulitis in these legs. She isn't treated for some time through the clinic. She is due to go back there Monday of next week. She denies any fever or chills.  Relevant past medical, surgical, family and social history reviewed and updated as indicated. Allergies and medications reviewed and updated.  Past Medical History:  Diagnosis Date  . Asthma   . BBB (bundle branch block)    left   . Esophagitis   . Fibrocystic breast   . GERD (gastroesophageal reflux disease)   . Hemidiaphragm paralysis   . History of obesity    lost 100 lbs  . Hyperlipidemia   . Hypertension   . Hypotonic bladder   . IBS (irritable bowel syndrome)   . Insomnia   . Osteoarthritis    shoulders   . Osteopenia   . Paresis ([place])    rt. pelvis   . Premature ventricular contraction   . Rectocele    distal ; with outlet obstruction   . Thyroid disease    hypothyroidism  . Vitamin D deficiency     Past Surgical History:  Procedure Laterality Date  . BREAST FIBROADENOMA SURGERY Right   . CATARACT EXTRACTION  1999  . EYE SURGERY    . l-s spine surgery  1980  . right carpal tunnel surgery x3  1996  . VAGINAL HYSTERECTOMY Bilateral   . VESICOVAGINAL FISTULA CLOSURE W/ TAH  1970   Dr. Joneen Caraway      Review of Systems  Constitutional: Negative.  Negative for activity change, fatigue and fever.  HENT: Negative.   Eyes:  Negative.   Respiratory: Negative.  Negative for cough.   Cardiovascular: Negative.  Negative for chest pain.  Gastrointestinal: Negative.  Negative for abdominal pain.  Endocrine: Negative.   Genitourinary: Negative.  Negative for dysuria.  Musculoskeletal: Negative.   Skin: Positive for color change, rash and wound.  Neurological: Negative.     Allergies as of 01/28/2017      Reactions   Alprazolam    Buspar [buspirone Hcl]    Lorazepam       Medication List       Accurate as of 01/28/17 11:59 PM. Always use your most recent med list.          buPROPion 150 MG 12 hr tablet Commonly known as:  WELLBUTRIN SR Take 1 tablet (150 mg total) by mouth 2 (two) times daily.   clindamycin 150 MG capsule Commonly known as:  CLEOCIN Take 1 capsule (150 mg total) by mouth 3 (three) times daily.   furosemide 40 MG tablet Commonly known as:  LASIX Take 1 tablet (40 mg total) by mouth daily.   ICAPS AREDS 2 PO Take by mouth.   lisinopril-hydrochlorothiazide 10-12.5 MG tablet Commonly known as:  PRINZIDE,ZESTORETIC Take 1 tablet by mouth daily.   metolazone 2.5 MG tablet Commonly known as:  ZAROXOLYN Take one tablet on Monday, Wednesday and Friday   mupirocin ointment 2 % Commonly known as:  BACTROBAN Apply 1 application topically 3 (three) times daily.   Naltrexone-Bupropion HCl ER 8-90 MG Tb12 Commonly known as:  CONTRAVE 1 tablet Po Qam x7d ; then 1 tab po BID x 7 d; then 2 tab in AM and 1 in PM X7d; Then 2 po BID from then on   Naltrexone-Bupropion HCl ER 8-90 MG Tb12 Commonly known as:  CONTRAVE Take 2 tablets by mouth 2 (two) times daily.   nortriptyline 75 MG capsule Commonly known as:  PAMELOR Take 1 capsule (75 mg total) by mouth at bedtime.   nystatin cream Commonly known as:  MYCOSTATIN Apply 1 application topically 2 (two) times daily.   ranitidine 150 MG tablet Commonly known as:  ZANTAC Take 1 tablet (150 mg total) by mouth 2 (two) times daily.     silver sulfADIAZINE 1 % cream Commonly known as:  SILVADENE Apply 1 application topically daily.   simvastatin 40 MG tablet Commonly known as:  ZOCOR Take 1 tablet (40 mg total) by mouth daily with breakfast.   triamcinolone cream 0.1 % Commonly known as:  KENALOG Apply 1 application topically 2 (two) times daily.   Vitamin D 2000 units Caps Take 4,000 Units by mouth daily. One daily          Objective:    BP 121/74   Pulse 81   Temp 98.2 F (36.8 C) (Oral)   Ht _0  (1.626 m)   Wt 222 lb (100.7 kg)   BMI 38.11 kg/m   Allergies  Allergen Reactions  . Alprazolam   . Buspar [Buspirone Hcl]   . Lorazepam     Physical Exam  Constitutional: She is oriented to person, place, and time. She appears well-developed and well-nourished.  HENT:  Head: Normocephalic and atraumatic.  Eyes: Pupils are equal, round, and reactive to light. Conjunctivae and EOM are normal.  Cardiovascular: Normal rate, regular rhythm, normal heart sounds and intact distal pulses.   Pulmonary/Chest: Effort normal and breath sounds normal.  Abdominal: Soft. Bowel sounds are normal.  Neurological: She is alert and oriented to person, place, and time. She has normal reflexes.  Skin: Skin is warm and dry. Rash noted. There is erythema.  5 mm shallow ulcer seen on the right medial foot. There is no severe drainage at this time.  Psychiatric: She has a normal mood and affect. Her behavior is normal. Judgment and thought content normal.  Nursing note and vitals reviewed.   Results for orders placed or performed in visit on 12/02/16  CMP14+EGFR  Result Value Ref Range   Glucose 92 65 - 99 mg/dL   BUN 20 8 - 27 mg/dL   Creatinine, Ser 1.06 (H) 0.57 - 1.00 mg/dL   GFR calc non Af Amer 50 (L) >59 mL/min/1.73   GFR calc Af Amer 58 (L) >59 mL/min/1.73   BUN/Creatinine Ratio 19 12 - 28   Sodium 143 134 - 144 mmol/L   Potassium 4.4 3.5 - 5.2 mmol/L   Chloride 101 96 - 106 mmol/L   CO2 27 18 - 29 mmol/L    Calcium 9.0 8.7 - 10.3 mg/dL   Total Protein 6.8 6.0 - 8.5 g/dL   Albumin 3.6 3.5 - 4.8 g/dL   Globulin, Total 3.2 1.5 - 4.5 g/dL   Albumin/Globulin Ratio  1.1 (L) 1.2 - 2.2   Bilirubin Total 0.2 0.0 - 1.2 mg/dL   Alkaline Phosphatase 124 (H) 39 - 117 IU/L   AST 24 0 - 40 IU/L   ALT 23 0 - 32 IU/L  Lipid panel  Result Value Ref Range   Cholesterol, Total 160 100 - 199 mg/dL   Triglycerides 66 0 - 149 mg/dL   HDL 55 >39 mg/dL   VLDL Cholesterol Cal 13 5 - 40 mg/dL   LDL Calculated 92 0 - 99 mg/dL   Chol/HDL Ratio 2.9 0.0 - 4.4 ratio  TSH  Result Value Ref Range   TSH 3.200 0.450 - 4.500 uIU/mL      Assessment & Plan:   1. Cellulitis of right lower extremity - silver sulfADIAZINE (SILVADENE) 1 % cream; Apply 1 application topically daily.  Dispense: 400 g; Refill: 0 - clindamycin (CLEOCIN) 150 MG capsule; Take 1 capsule (150 mg total) by mouth 3 (three) times daily.  Dispense: 30 capsule; Refill: 0  2. Lymphedema Following with lymphedema clinic. She doesn't Monday and Wednesday each week.  Continue all other maintenance medications as listed above.  Follow up plan: No Follow-up on file.  Educational handout given for [place] PA-C [facility] [address]  [place], [place] [address] [phone]   01/29/2017, 2:10 PM

## 2017-01-29 NOTE — Patient Instructions (Signed)
In a few days you may receive a survey in the mail or online from [employer] regarding your visit with us today. Please take a moment to fill this out. Your feedback is very important to our whole office. It can help us better understand your needs as well as improve your experience and satisfaction. Thank you for taking your time to complete it. We care about you.  Khelani Kops, PA-C  

## 2017-02-01 ENCOUNTER — Telehealth: Payer: Self-pay | Admitting: Family Medicine

## 2017-02-01 DIAGNOSIS — I890 Lymphedema, not elsewhere classified: Secondary | ICD-10-CM | POA: Diagnosis not present

## 2017-02-01 NOTE — Telephone Encounter (Signed)
LM for pt to call back for appt  

## 2017-02-03 DIAGNOSIS — I890 Lymphedema, not elsewhere classified: Secondary | ICD-10-CM | POA: Diagnosis not present

## 2017-02-05 ENCOUNTER — Encounter: Payer: Self-pay | Admitting: Family Medicine

## 2017-02-05 ENCOUNTER — Ambulatory Visit (INDEPENDENT_AMBULATORY_CARE_PROVIDER_SITE_OTHER): Payer: Medicare Other | Admitting: Family Medicine

## 2017-02-05 VITALS — BP 146/91 | HR 91 | Temp 99.1°F | Ht 64.0 in | Wt 222.0 lb

## 2017-02-05 DIAGNOSIS — I890 Lymphedema, not elsewhere classified: Secondary | ICD-10-CM

## 2017-02-05 DIAGNOSIS — N6320 Unspecified lump in the left breast, unspecified quadrant: Secondary | ICD-10-CM | POA: Diagnosis not present

## 2017-02-05 NOTE — Progress Notes (Signed)
BP (!) 146/91   Pulse 91   Temp 99.1 F (37.3 C) (Oral)   Ht 5\' 4"  (1.626 m)   Wt 222 lb (100.7 kg)   BMI 38.11 kg/m    Subjective:    Patient ID: Betty Keller, female    DOB: 10-14-1935, 81 y.o.   MRN: [id number]  HPI: Betty Keller is a 81 y.o. female presenting on 02/05/2017 for Cellulitis right leg - weeping and Left breast lump   HPI Swelling and edema of both legs Patient comes in complaining of increased swelling and fluid drainage and redness and warmth to both lower extremities. She says is worse on the right than the left. She had been seen in the lymphedema clinic but they said that they're worried about the drainage and possible infection and wanted her to come see [place] first. Patient does admit that she has not been consistent about wearing her compression stockings. She denies any fevers or chills. She denies any redness or warmth outside of her lower legs. She says they are tender because they are so swollen. The drainage that she's been having out of them has been clear/yellow and watery.  Left breast mass Patient has a new palpable left breast mass that she noticed just in the past week. She denies any skin color changes or nipple discharge. The mass does feel mobile and is nontender.  Relevant past medical, surgical, family and social history reviewed and updated as indicated. Interim medical history since our last visit reviewed. Allergies and medications reviewed and updated.  Review of Systems  Constitutional: Negative for chills and fever.  Respiratory: Negative for chest tightness and shortness of breath.   Cardiovascular: Positive for leg swelling. Negative for chest pain and palpitations.  Skin: Positive for color change. Negative for rash.  Neurological: Negative for light-headedness and headaches.  Psychiatric/Behavioral: Negative for agitation and behavioral problems.  All other systems reviewed and are negative.   Per HPI unless specifically  indicated above        Objective:    BP (!) 146/91   Pulse 91   Temp 99.1 F (37.3 C) (Oral)   Ht 5\' 4"  (1.626 m)   Wt 222 lb (100.7 kg)   BMI 38.11 kg/m   Wt Readings from Last 3 Encounters:  02/05/17 222 lb (100.7 kg)  01/28/17 222 lb (100.7 kg)  01/06/17 221 lb (100.2 kg)    Physical Exam  Constitutional: She is oriented to person, place, and time. She appears well-developed and well-nourished. No distress.  Eyes: Conjunctivae are normal.  Cardiovascular: Normal rate, regular rhythm, normal heart sounds and intact distal pulses.   No murmur heard. Pulmonary/Chest: Effort normal and breath sounds normal. No respiratory distress. She has no wheezes. She has no rales. Right breast exhibits no inverted nipple, no mass, no nipple discharge, no skin change and no tenderness. Left breast exhibits mass. Left breast exhibits no inverted nipple, no nipple discharge, no skin change and no tenderness. Breasts are symmetrical.    Musculoskeletal: Normal range of motion. She exhibits edema (Edema and straw-colored drainage from both lower extremities, erythematous and tender to palpation. 3+ pitting edema).  Neurological: She is alert and oriented to person, place, and time. Coordination normal.  Skin: Skin is warm and dry. No rash noted. She is not diaphoretic.  Psychiatric: She has a normal mood and affect. Her behavior is normal.  Nursing note and vitals reviewed.     Assessment & Plan:   Problem List  Items Addressed This Visit    None    Visit Diagnoses    Left breast mass    -  Primary   Relevant Orders   MM DIAG BREAST TOMO UNI LEFT   Lymphedema of both lower extremities       Do Ace wrap some bilateral lower extremities get her back to lymphedema clinic, no signs of infection       Nurse Ace wrap both patient's legs and she will go back to the lymphedema clinic  Follow up plan: Return if symptoms worsen or fail to improve.  Counseling provided for all of the vaccine  components Orders Placed This Encounter  Procedures  . MM DIAG BREAST TOMO UNI LEFT    Arville CareJoshua Dettinger, MD [facility] 02/05/2017, 3:45 PM

## 2017-02-08 DIAGNOSIS — I890 Lymphedema, not elsewhere classified: Secondary | ICD-10-CM | POA: Diagnosis not present

## 2017-02-11 ENCOUNTER — Encounter: Payer: Self-pay | Admitting: Family Medicine

## 2017-02-11 ENCOUNTER — Ambulatory Visit (INDEPENDENT_AMBULATORY_CARE_PROVIDER_SITE_OTHER): Payer: Medicare Other | Admitting: Family Medicine

## 2017-02-11 VITALS — BP 143/81 | HR 94 | Temp 98.3°F | Ht 64.0 in | Wt 225.0 lb

## 2017-02-11 DIAGNOSIS — K2100 Gastro-esophageal reflux disease with esophagitis, without bleeding: Secondary | ICD-10-CM

## 2017-02-11 DIAGNOSIS — I890 Lymphedema, not elsewhere classified: Secondary | ICD-10-CM

## 2017-02-11 MED ORDER — OMEPRAZOLE 20 MG PO CPDR: 20.0000 mg | DELAYED_RELEASE_CAPSULE | Freq: Every day | ORAL | 2 refills | Status: AC

## 2017-02-11 NOTE — Progress Notes (Signed)
BP (!) 143/84   Pulse 94   Temp 98.3 F (36.8 C) (Oral)   Ht 5\' 4"  (1.626 m)   Wt 225 lb (102.1 kg)   BMI 38.62 kg/m    Subjective:    Patient ID: Betty Keller, female    DOB: 1936/05/03, 81 y.o.   MRN: [id number]  HPI: Betty GunnelsBarbara A Goedde is a 81 y.o. female presenting on 02/11/2017 for Cellulitis (2 week followup) and Heartburn (on zantac for several years, would like to try something else)   HPI Lymphedema recheck and cellulitis recheck Patient comes in with complaints for lymphedema that has slightly improved and are weeping and drainage has improved. The redness and warmth that was there a couple weeks ago has also improved and she did finish the antibiotic. She will start going back to the lymphedema clinic now to help monitor this. She denies any fevers or chills.  GERD recheck full-time Patient comes in complaining that her Zantac is not working anymore she is starting to get some more where food is coming back up and acid is coming back up into her esophagus and coming into her throat. She would like to change her medication to see if something else would help her better. She denies any blood in her stool or significant abdominal pain today. She denies any nausea or vomiting  Relevant past medical, surgical, family and social history reviewed and updated as indicated. Interim medical history since our last visit reviewed. Allergies and medications reviewed and updated.  Review of Systems  Constitutional: Negative for chills and fever.  HENT: Negative for congestion, ear discharge and ear pain.   Eyes: Negative for redness and visual disturbance.  Respiratory: Negative for chest tightness and shortness of breath.   Cardiovascular: Positive for leg swelling. Negative for chest pain.  Gastrointestinal: Positive for abdominal pain. Negative for blood in stool, constipation, diarrhea, nausea and vomiting.  Genitourinary: Negative for difficulty urinating and dysuria.    Musculoskeletal: Negative for back pain and gait problem.  Skin: Negative for rash.  Neurological: Negative for light-headedness and headaches.  Psychiatric/Behavioral: Negative for agitation and behavioral problems.  All other systems reviewed and are negative.   Per HPI unless specifically indicated above        Objective:    BP (!) 143/84   Pulse 94   Temp 98.3 F (36.8 C) (Oral)   Ht 5\' 4"  (1.626 m)   Wt 225 lb (102.1 kg)   BMI 38.62 kg/m   Wt Readings from Last 3 Encounters:  02/11/17 225 lb (102.1 kg)  02/05/17 222 lb (100.7 kg)  01/28/17 222 lb (100.7 kg)    Physical Exam  Constitutional: She is oriented to person, place, and time. She appears well-developed and well-nourished. No distress.  Eyes: Conjunctivae are normal.  Cardiovascular: Normal rate, regular rhythm, normal heart sounds and intact distal pulses.   No murmur heard. Pulmonary/Chest: Effort normal and breath sounds normal. No respiratory distress. She has no wheezes. She has no rales.  Abdominal: Soft. Bowel sounds are normal. She exhibits no distension. There is no tenderness. There is no rebound.  Musculoskeletal: Normal range of motion. She exhibits edema (4+ edema, some pitting). She exhibits no tenderness.  Neurological: She is alert and oriented to person, place, and time. Coordination normal.  Skin: Skin is warm and dry. No rash noted. She is not diaphoretic.  Psychiatric: She has a normal mood and affect. Her behavior is normal.  Nursing note and vitals reviewed.  Assessment & Plan:   Problem List Items Addressed This Visit      Digestive   GERD (gastroesophageal reflux disease) - Primary   Relevant Medications   omeprazole (PRILOSEC) 20 MG capsule     Other   Lymphedema       Follow up plan: Return in about 3 months (around 05/14/2017), or if symptoms worsen or fail to improve.  Counseling provided for all of the vaccine components No orders of the defined types were  placed in this encounter.   Arville CareJoshua Jasenia Weilbacher, MD [facility] 02/11/2017, 2:55 PM

## 2017-02-15 ENCOUNTER — Other Ambulatory Visit: Payer: Self-pay | Admitting: Family Medicine

## 2017-02-15 DIAGNOSIS — I890 Lymphedema, not elsewhere classified: Secondary | ICD-10-CM | POA: Diagnosis not present

## 2017-02-15 DIAGNOSIS — N6320 Unspecified lump in the left breast, unspecified quadrant: Secondary | ICD-10-CM

## 2017-02-18 DIAGNOSIS — I890 Lymphedema, not elsewhere classified: Secondary | ICD-10-CM | POA: Diagnosis not present

## 2017-02-22 ENCOUNTER — Other Ambulatory Visit: Payer: Self-pay | Admitting: Family Medicine

## 2017-02-22 ENCOUNTER — Ambulatory Visit
Admission: RE | Admit: 2017-02-22 | Discharge: 2017-02-22 | Disposition: A | Discharge status: Home / Self Care | Payer: Medicare Other | Source: Ambulatory Visit | Attending: Family Medicine | Admitting: Family Medicine

## 2017-02-22 DIAGNOSIS — N6320 Unspecified lump in the left breast, unspecified quadrant: Secondary | ICD-10-CM

## 2017-02-22 DIAGNOSIS — R928 Other abnormal and inconclusive findings on diagnostic imaging of breast: Secondary | ICD-10-CM | POA: Diagnosis not present

## 2017-02-22 DIAGNOSIS — N6489 Other specified disorders of breast: Secondary | ICD-10-CM | POA: Diagnosis not present

## 2017-02-23 DIAGNOSIS — I890 Lymphedema, not elsewhere classified: Secondary | ICD-10-CM | POA: Diagnosis not present

## 2017-02-26 DIAGNOSIS — I890 Lymphedema, not elsewhere classified: Secondary | ICD-10-CM | POA: Diagnosis not present

## 2017-03-01 DIAGNOSIS — I890 Lymphedema, not elsewhere classified: Secondary | ICD-10-CM | POA: Diagnosis not present

## 2017-03-04 ENCOUNTER — Ambulatory Visit: Payer: Medicare Other | Admitting: Family Medicine

## 2017-03-04 DIAGNOSIS — I890 Lymphedema, not elsewhere classified: Secondary | ICD-10-CM | POA: Diagnosis not present

## 2017-03-09 ENCOUNTER — Encounter: Payer: Self-pay | Admitting: Family Medicine

## 2017-03-09 ENCOUNTER — Ambulatory Visit (INDEPENDENT_AMBULATORY_CARE_PROVIDER_SITE_OTHER): Payer: Medicare Other | Admitting: Family Medicine

## 2017-03-09 VITALS — BP 128/82 | HR 84 | Temp 99.1°F | Ht 64.0 in | Wt 236.0 lb

## 2017-03-09 DIAGNOSIS — L03115 Cellulitis of right lower limb: Secondary | ICD-10-CM | POA: Diagnosis not present

## 2017-03-09 DIAGNOSIS — I890 Lymphedema, not elsewhere classified: Secondary | ICD-10-CM | POA: Diagnosis not present

## 2017-03-09 DIAGNOSIS — R2681 Unsteadiness on feet: Secondary | ICD-10-CM | POA: Diagnosis not present

## 2017-03-09 DIAGNOSIS — R5381 Other malaise: Secondary | ICD-10-CM

## 2017-03-09 MED ORDER — SULFAMETHOXAZOLE-TRIMETHOPRIM 800-160 MG PO TABS: 1.0000 | ORAL_TABLET | Freq: Two times a day (BID) | ORAL | 0 refills | Status: AC

## 2017-03-09 NOTE — Progress Notes (Signed)
BP 128/82   Pulse 84   Temp 99.1 F (37.3 C) (Oral)   Ht 5\' 4"  (1.626 m)   Wt 236 lb (107 kg)   BMI 40.51 kg/m    Subjective:    Patient ID: Betty Keller, female    DOB: 1935/10/10, 81 y.o.   MRN: [id number]  HPI: Betty Keller is a 81 y.o. female presenting on 03/09/2017 for Cellulitis (seen at lymphedema clinic yesterday, legs red and warm to touch, recommended she be seen by PCP)  HPI Redness and warmth of legs Patient is coming in complaining of redness and warmth of her legs. She is seeing the lymphedema clinic and they have been working on her and said that she started feeling warm and red and center here to get treatment for it and want her to get treatment before she can go back to get more therapy with them. She denies any fevers or chills or redness or warmth anywhere else. The redness and warmth is more in her right leg than her left. She denies any pain more than what she normally gets with the swelling and stretching.  Weakness and gait instability and difficulty walking Patient has complaints of weakness and gait instability and difficulty walking and would like to try to be more active to help with the lymphedema and would like a walker to help with that.  Relevant past medical, surgical, family and social history reviewed and updated as indicated. Interim medical history since our last visit reviewed. Allergies and medications reviewed and updated.  Review of Systems  Constitutional: Negative for chills and fever.  Eyes: Negative for visual disturbance.  Respiratory: Negative for chest tightness and shortness of breath.   Cardiovascular: Positive for leg swelling. Negative for chest pain.  Musculoskeletal: Negative for back pain and gait problem.  Skin: Positive for color change. Negative for rash.  Neurological: Negative for light-headedness and headaches.  Psychiatric/Behavioral: Negative for agitation and behavioral problems.  All other systems reviewed and  are negative.  Per HPI unless specifically indicated above     Objective:    BP 128/82   Pulse 84   Temp 99.1 F (37.3 C) (Oral)   Ht 5\' 4"  (1.626 m)   Wt 236 lb (107 kg)   BMI 40.51 kg/m   Wt Readings from Last 3 Encounters:  03/09/17 236 lb (107 kg)  02/11/17 225 lb (102.1 kg)  02/05/17 222 lb (100.7 kg)    Physical Exam  Constitutional: She is oriented to person, place, and time. She appears well-developed and well-nourished. No distress.  Eyes: Conjunctivae are normal.  Neck: Neck supple. No thyromegaly present.  Cardiovascular: Normal rate, regular rhythm, normal heart sounds and intact distal pulses.   No murmur heard. Pulmonary/Chest: Effort normal and breath sounds normal. No respiratory distress. She has no wheezes. She has no rales.  Musculoskeletal: Normal range of motion. She exhibits edema (3+). She exhibits no tenderness.  Lymphadenopathy:    She has no cervical adenopathy.  Neurological: She is alert and oriented to person, place, and time. Coordination normal.  Skin: Skin is warm and dry. No rash noted. She is not diaphoretic. There is erythema.  Psychiatric: She has a normal mood and affect. Her behavior is normal.  Nursing note and vitals reviewed.     Assessment & Plan:   Problem List Items Addressed This Visit      Other   Cellulitis of right lower extremity - Primary   Relevant Medications  sulfamethoxazole-trimethoprim (BACTRIM DS,SEPTRA DS) 800-160 MG tablet    Other Visit Diagnoses    Lymphedema of both lower extremities       Physical deconditioning       Relevant Orders   DME Walker platform   Gait instability       Relevant Orders   DME Walker platform      Follow up plan: Return if symptoms worsen or fail to improve.  Counseling provided for all of the vaccine components Orders Placed This Encounter  Procedures  . DME Walker platform    Arville Care, MD [facility] 03/09/2017, 8:44 AM

## 2017-03-15 DIAGNOSIS — I890 Lymphedema, not elsewhere classified: Secondary | ICD-10-CM | POA: Diagnosis not present

## 2017-03-16 ENCOUNTER — Telehealth: Payer: Self-pay | Admitting: Family Medicine

## 2017-03-16 DIAGNOSIS — R2681 Unsteadiness on feet: Secondary | ICD-10-CM

## 2017-03-16 NOTE — Telephone Encounter (Signed)
Go ahead and put in order for face-to-face based on my last note

## 2017-03-16 NOTE — Telephone Encounter (Signed)
Please review and advise.

## 2017-03-17 NOTE — Telephone Encounter (Signed)
Order placed

## 2017-03-18 DIAGNOSIS — L03115 Cellulitis of right lower limb: Secondary | ICD-10-CM | POA: Diagnosis not present

## 2017-03-18 DIAGNOSIS — Z7982 Long term (current) use of aspirin: Secondary | ICD-10-CM | POA: Diagnosis not present

## 2017-03-18 DIAGNOSIS — I890 Lymphedema, not elsewhere classified: Secondary | ICD-10-CM | POA: Diagnosis not present

## 2017-03-18 DIAGNOSIS — Z9181 History of falling: Secondary | ICD-10-CM | POA: Diagnosis not present

## 2017-03-19 ENCOUNTER — Telehealth: Payer: Self-pay | Admitting: Family Medicine

## 2017-03-19 DIAGNOSIS — L03115 Cellulitis of right lower limb: Secondary | ICD-10-CM | POA: Diagnosis not present

## 2017-03-19 DIAGNOSIS — Z7982 Long term (current) use of aspirin: Secondary | ICD-10-CM | POA: Diagnosis not present

## 2017-03-19 DIAGNOSIS — I890 Lymphedema, not elsewhere classified: Secondary | ICD-10-CM | POA: Diagnosis not present

## 2017-03-19 DIAGNOSIS — Z9181 History of falling: Secondary | ICD-10-CM | POA: Diagnosis not present

## 2017-03-19 NOTE — Telephone Encounter (Signed)
Pt questions answered regarding PT

## 2017-03-23 ENCOUNTER — Telehealth: Payer: Self-pay | Admitting: Family Medicine

## 2017-03-23 DIAGNOSIS — Z7982 Long term (current) use of aspirin: Secondary | ICD-10-CM | POA: Diagnosis not present

## 2017-03-23 DIAGNOSIS — L03115 Cellulitis of right lower limb: Secondary | ICD-10-CM | POA: Diagnosis not present

## 2017-03-23 DIAGNOSIS — Z9181 History of falling: Secondary | ICD-10-CM | POA: Diagnosis not present

## 2017-03-23 DIAGNOSIS — I890 Lymphedema, not elsewhere classified: Secondary | ICD-10-CM | POA: Diagnosis not present

## 2017-03-23 NOTE — Telephone Encounter (Signed)
FYI, patient calling to confirm that she is to keep feet elevated above heart when sitting, exercising legs as much as possible and wear wraps as instructed.  I confirmed with patient this is correct.

## 2017-03-24 ENCOUNTER — Telehealth: Payer: Self-pay | Admitting: *Deleted

## 2017-03-24 NOTE — Telephone Encounter (Signed)
Needing to confirm Cellulitis on lower right leg or both legs Please call home health nurse back with verification

## 2017-03-25 DIAGNOSIS — L03115 Cellulitis of right lower limb: Secondary | ICD-10-CM | POA: Diagnosis not present

## 2017-03-25 DIAGNOSIS — Z7982 Long term (current) use of aspirin: Secondary | ICD-10-CM | POA: Diagnosis not present

## 2017-03-25 DIAGNOSIS — I890 Lymphedema, not elsewhere classified: Secondary | ICD-10-CM | POA: Diagnosis not present

## 2017-03-25 DIAGNOSIS — Z9181 History of falling: Secondary | ICD-10-CM | POA: Diagnosis not present

## 2017-03-29 DIAGNOSIS — L03115 Cellulitis of right lower limb: Secondary | ICD-10-CM | POA: Diagnosis not present

## 2017-03-29 DIAGNOSIS — Z9181 History of falling: Secondary | ICD-10-CM | POA: Diagnosis not present

## 2017-03-29 DIAGNOSIS — Z7982 Long term (current) use of aspirin: Secondary | ICD-10-CM | POA: Diagnosis not present

## 2017-03-29 DIAGNOSIS — I890 Lymphedema, not elsewhere classified: Secondary | ICD-10-CM | POA: Diagnosis not present

## 2017-03-31 DIAGNOSIS — Z9181 History of falling: Secondary | ICD-10-CM | POA: Diagnosis not present

## 2017-03-31 DIAGNOSIS — Z7982 Long term (current) use of aspirin: Secondary | ICD-10-CM | POA: Diagnosis not present

## 2017-03-31 DIAGNOSIS — L03115 Cellulitis of right lower limb: Secondary | ICD-10-CM | POA: Diagnosis not present

## 2017-03-31 DIAGNOSIS — I890 Lymphedema, not elsewhere classified: Secondary | ICD-10-CM | POA: Diagnosis not present

## 2017-04-05 DIAGNOSIS — Z7982 Long term (current) use of aspirin: Secondary | ICD-10-CM | POA: Diagnosis not present

## 2017-04-05 DIAGNOSIS — Z9181 History of falling: Secondary | ICD-10-CM | POA: Diagnosis not present

## 2017-04-05 DIAGNOSIS — I890 Lymphedema, not elsewhere classified: Secondary | ICD-10-CM | POA: Diagnosis not present

## 2017-04-05 DIAGNOSIS — L03115 Cellulitis of right lower limb: Secondary | ICD-10-CM | POA: Diagnosis not present

## 2017-04-08 DIAGNOSIS — L03115 Cellulitis of right lower limb: Secondary | ICD-10-CM | POA: Diagnosis not present

## 2017-04-08 DIAGNOSIS — I890 Lymphedema, not elsewhere classified: Secondary | ICD-10-CM | POA: Diagnosis not present

## 2017-04-08 DIAGNOSIS — Z9181 History of falling: Secondary | ICD-10-CM | POA: Diagnosis not present

## 2017-04-08 DIAGNOSIS — Z7982 Long term (current) use of aspirin: Secondary | ICD-10-CM | POA: Diagnosis not present

## 2017-04-09 DIAGNOSIS — L03115 Cellulitis of right lower limb: Secondary | ICD-10-CM | POA: Diagnosis not present

## 2017-04-09 DIAGNOSIS — Z9181 History of falling: Secondary | ICD-10-CM | POA: Diagnosis not present

## 2017-04-09 DIAGNOSIS — I890 Lymphedema, not elsewhere classified: Secondary | ICD-10-CM | POA: Diagnosis not present

## 2017-04-09 DIAGNOSIS — Z7982 Long term (current) use of aspirin: Secondary | ICD-10-CM | POA: Diagnosis not present

## 2017-04-13 DIAGNOSIS — Z7982 Long term (current) use of aspirin: Secondary | ICD-10-CM | POA: Diagnosis not present

## 2017-04-13 DIAGNOSIS — Z9181 History of falling: Secondary | ICD-10-CM | POA: Diagnosis not present

## 2017-04-13 DIAGNOSIS — I890 Lymphedema, not elsewhere classified: Secondary | ICD-10-CM | POA: Diagnosis not present

## 2017-04-13 DIAGNOSIS — L03115 Cellulitis of right lower limb: Secondary | ICD-10-CM | POA: Diagnosis not present

## 2017-04-16 ENCOUNTER — Ambulatory Visit (INDEPENDENT_AMBULATORY_CARE_PROVIDER_SITE_OTHER): Payer: Medicare Other | Admitting: Family Medicine

## 2017-04-16 DIAGNOSIS — L03115 Cellulitis of right lower limb: Secondary | ICD-10-CM

## 2017-04-16 DIAGNOSIS — Z7982 Long term (current) use of aspirin: Secondary | ICD-10-CM | POA: Diagnosis not present

## 2017-04-16 DIAGNOSIS — I890 Lymphedema, not elsewhere classified: Secondary | ICD-10-CM | POA: Diagnosis not present

## 2017-04-16 DIAGNOSIS — Z9181 History of falling: Secondary | ICD-10-CM

## 2017-05-17 ENCOUNTER — Encounter: Payer: Self-pay | Admitting: Family Medicine

## 2017-05-17 ENCOUNTER — Ambulatory Visit (INDEPENDENT_AMBULATORY_CARE_PROVIDER_SITE_OTHER): Payer: Medicare Other | Admitting: Family Medicine

## 2017-05-17 VITALS — BP 136/73 | HR 82 | Temp 99.2°F | Ht 64.0 in | Wt 226.0 lb

## 2017-05-17 DIAGNOSIS — K2100 Gastro-esophageal reflux disease with esophagitis, without bleeding: Secondary | ICD-10-CM

## 2017-05-17 DIAGNOSIS — E782 Mixed hyperlipidemia: Secondary | ICD-10-CM | POA: Diagnosis not present

## 2017-05-17 DIAGNOSIS — R0981 Nasal congestion: Secondary | ICD-10-CM

## 2017-05-17 DIAGNOSIS — E039 Hypothyroidism, unspecified: Secondary | ICD-10-CM

## 2017-05-17 DIAGNOSIS — I10 Essential (primary) hypertension: Secondary | ICD-10-CM

## 2017-05-17 MED ORDER — FLUTICASONE PROPIONATE 50 MCG/ACT NA SUSP: 1.0000 | Freq: Two times a day (BID) | NASAL | 6 refills | Status: AC | PRN

## 2017-05-17 MED ORDER — NYSTATIN 100000 UNIT/GM EX CREA: 1.0000 "application " | TOPICAL_CREAM | Freq: Two times a day (BID) | CUTANEOUS | 2 refills | Status: AC

## 2017-05-17 NOTE — Progress Notes (Signed)
BP 136/73   Pulse 82   Temp 99.2 F (37.3 C) (Oral)   Ht _0  (1.626 m)   Wt 226 lb (102.5 kg)   BMI 38.79 kg/m    Subjective:    Patient ID: Betty Keller, female    DOB: [birth year], 81 y.o.   MRN: [id number]  HPI: Betty Keller is a 81 y.o. female presenting on 05/17/2017 for Hypothyroidism (3 mo follow up); Hyperlipidemia; Hypertension; and Sinusitis (sinus congestion, facial pain, headache, soreness in gums)   HPI Hypothyroidism recheck Patient is coming in for thyroid recheck today as well. They deny any issues with hair changes or heat or cold problems or diarrhea or constipation. They deny any chest pain or palpitations. They are currently on no medication but we are just monitoring, she is to be on 50 mcg of levothyroxine  Hyperlipidemia Patient is coming in for recheck of his hyperlipidemia. The patient is currently taking simvastatin. They deny any issues with myalgias or history of liver damage from it. They deny any focal numbness or weakness or chest pain.   Hypertension Patient is currently on lisinopril-hydrochlorothiazide, and their blood pressure today is 136/73. Patient denies any lightheadedness or dizziness. Patient denies headaches, blurred vision, chest pains, shortness of breath, or weakness. Denies any side effects from medication and is content with current medication.   GERD Patient is currently on omeprazole.  She denies any major symptoms or abdominal pain or belching or burping. She denies any blood in her stool or lightheadedness or dizziness.   Sinus congestion Patient is coming in for sinus congestion that has been bothering her over the past 2 days.  She denies any fevers or chills or shortness of breath or wheezing.  She does not have much of a cough but when she does its at night.  She mainly feels pressure in the maxillary region of both sinuses that goes down towards her teeth on the right side.  She has dentures and they have been well fitting  and she denies any gum issues.  Relevant past medical, surgical, family and social history reviewed and updated as indicated. Interim medical history since our last visit reviewed. Allergies and medications reviewed and updated.  Review of Systems  Constitutional: Negative for chills and fever.  HENT: Positive for congestion, postnasal drip and sinus pressure. Negative for ear discharge, ear pain, rhinorrhea, sneezing and sore throat.   Eyes: Negative for pain, redness and visual disturbance.  Respiratory: Negative for cough, chest tightness and shortness of breath.   Cardiovascular: Negative for chest pain and leg swelling.  Genitourinary: Negative for difficulty urinating and dysuria.  Musculoskeletal: Negative for back pain and gait problem.  Skin: Negative for rash.  Neurological: Negative for light-headedness and headaches.  Psychiatric/Behavioral: Negative for agitation and behavioral problems.  All other systems reviewed and are negative.   Per HPI unless specifically indicated above        Objective:    BP 136/73   Pulse 82   Temp 99.2 F (37.3 C) (Oral)   Ht _1  (1.626 m)   Wt 226 lb (102.5 kg)   BMI 38.79 kg/m   Wt Readings from Last 3 Encounters:  05/17/17 226 lb (102.5 kg)  03/09/17 236 lb (107 kg)  02/11/17 225 lb (102.1 kg)    Physical Exam  Constitutional: She is oriented to person, place, and time. She appears well-developed and well-nourished. No distress.  HENT:  Right Ear: Tympanic membrane, external ear and ear  canal normal.  Left Ear: Tympanic membrane, external ear and ear canal normal.  Nose: Mucosal edema and rhinorrhea present. No epistaxis. Right sinus exhibits maxillary sinus tenderness. Right sinus exhibits no frontal sinus tenderness. Left sinus exhibits maxillary sinus tenderness. Left sinus exhibits no frontal sinus tenderness.  Mouth/Throat: Uvula is midline and mucous membranes are normal. Posterior oropharyngeal edema present. No  oropharyngeal exudate, posterior oropharyngeal erythema or tonsillar abscesses.  Eyes: Conjunctivae are normal.  Cardiovascular: Normal rate, regular rhythm, normal heart sounds and intact distal pulses.   No murmur heard. Pulmonary/Chest: Effort normal and breath sounds normal. No respiratory distress. She has no wheezes.  Musculoskeletal: Normal range of motion. She exhibits no edema or tenderness.  Neurological: She is alert and oriented to person, place, and time. Coordination normal.  Skin: Skin is warm and dry. No rash noted. She is not diaphoretic.  Psychiatric: She has a normal mood and affect. Her behavior is normal.  Nursing note and vitals reviewed.       Assessment & Plan:   Problem List Items Addressed This Visit      Cardiovascular and Mediastinum   HTN (hypertension)   Relevant Orders   CMP14+EGFR     Digestive   GERD (gastroesophageal reflux disease)     Endocrine   Hypothyroid - Primary   Relevant Orders   TSH     Other   Hyperlipidemia   Relevant Orders   Lipid panel    Other Visit Diagnoses    Sinus congestion       Relevant Medications   fluticasone (FLONASE) 50 MCG/ACT nasal spray       Follow up plan: Return in about 6 months (around 11/15/2017), or if symptoms worsen or fail to improve, for Hypertension and thyroid recheck.  Counseling provided for all of the vaccine components Orders Placed This Encounter  Procedures  . CMP14+EGFR  . Lipid panel  . TSH    Caryl Pina, MD [facility] 05/17/2017, 3:13 PM

## 2017-05-18 LAB — LIPID PANEL
Chol/HDL Ratio: 2.1 ratio (ref 0.0–4.4)
Cholesterol, Total: 125 mg/dL (ref 100–199)
HDL: 59 mg/dL (ref >39)
LDL CALC: 57 mg/dL (ref 0–99)
Triglycerides: 47 mg/dL (ref 0–149)
VLDL CHOLESTEROL CAL: 9 mg/dL (ref 5–40)

## 2017-05-18 LAB — CMP14+EGFR
ALT: 13 IU/L (ref 0–32)
AST: 12 IU/L (ref 0–40)
Albumin/Globulin Ratio: 1.1 — ABNORMAL LOW (ref 1.2–2.2)
Albumin: 3.4 g/dL — ABNORMAL LOW (ref 3.5–4.8)
Alkaline Phosphatase: 109 IU/L (ref 39–117)
BUN/Creatinine Ratio: 23 (ref 12–28)
BUN: 18 mg/dL (ref 8–27)
Bilirubin Total: 0.3 mg/dL (ref 0.0–1.2)
CALCIUM: 9 mg/dL (ref 8.7–10.3)
CHLORIDE: 97 mmol/L (ref 96–106)
CO2: 26 mmol/L (ref 20–29)
CREATININE: 0.79 mg/dL (ref 0.57–1.00)
GFR calc non Af Amer: 71 mL/min/{1.73_m2} (ref >59)
GFR, EST AFRICAN AMERICAN: 82 mL/min/{1.73_m2} (ref >59)
Globulin, Total: 3.1 g/dL (ref 1.5–4.5)
Glucose: 84 mg/dL (ref 65–99)
Potassium: 4.1 mmol/L (ref 3.5–5.2)
Sodium: 141 mmol/L (ref 134–144)
TOTAL PROTEIN: 6.5 g/dL (ref 6.0–8.5)

## 2017-05-18 LAB — TSH: TSH: 2.94 u[IU]/mL (ref 0.450–4.500)

## 2017-05-25 ENCOUNTER — Ambulatory Visit
Admission: RE | Admit: 2017-05-25 | Discharge: 2017-05-25 | Disposition: A | Discharge status: Home / Self Care | Payer: Medicare Other | Source: Ambulatory Visit | Attending: Family Medicine | Admitting: Family Medicine

## 2017-05-25 DIAGNOSIS — N6489 Other specified disorders of breast: Secondary | ICD-10-CM | POA: Diagnosis not present

## 2017-05-25 DIAGNOSIS — N6320 Unspecified lump in the left breast, unspecified quadrant: Secondary | ICD-10-CM

## 2017-05-26 ENCOUNTER — Other Ambulatory Visit: Payer: Medicare Other

## 2017-06-02 ENCOUNTER — Other Ambulatory Visit: Payer: Self-pay | Admitting: Family Medicine

## 2017-06-14 ENCOUNTER — Telehealth: Payer: Self-pay | Admitting: Family Medicine

## 2017-06-14 MED ORDER — AMOXICILLIN-POT CLAVULANATE 875-125 MG PO TABS: 1.0000 | ORAL_TABLET | Freq: Two times a day (BID) | ORAL | 0 refills | Status: AC

## 2017-06-14 NOTE — Telephone Encounter (Signed)
Pt aware.

## 2017-06-14 NOTE — Telephone Encounter (Signed)
Augmentin Prescription sent to pharmacy, continue Flonase

## 2017-06-14 NOTE — Telephone Encounter (Signed)
What symptoms do you have? Hurting around eyes and nose and gums are sore  How long have you been sick? Last week and a half  Have you been seen for this problem? Yes, was prescribed nose drops did not work wants antibiotic  If your provider decides to give you a prescription, which pharmacy would you like for it to be sent to? [facility] [place]   Patient informed that this information will be sent to the clinical staff for review and that they should receive a follow up call.

## 2017-07-15 ENCOUNTER — Encounter (HOSPITAL_COMMUNITY): Payer: Self-pay | Admitting: Emergency Medicine

## 2017-07-15 ENCOUNTER — Emergency Department (HOSPITAL_COMMUNITY)
Admission: EM | Admit: 2017-07-15 | Discharge: 2017-07-20 | Disposition: E | Payer: Medicare Other | Attending: Emergency Medicine | Admitting: Emergency Medicine

## 2017-07-15 DIAGNOSIS — J45909 Unspecified asthma, uncomplicated: Secondary | ICD-10-CM | POA: Insufficient documentation

## 2017-07-15 DIAGNOSIS — I10 Essential (primary) hypertension: Secondary | ICD-10-CM | POA: Insufficient documentation

## 2017-07-15 DIAGNOSIS — Z87891 Personal history of nicotine dependence: Secondary | ICD-10-CM | POA: Diagnosis not present

## 2017-07-15 DIAGNOSIS — I469 Cardiac arrest, cause unspecified: Secondary | ICD-10-CM | POA: Insufficient documentation

## 2017-07-15 DIAGNOSIS — Z79899 Other long term (current) drug therapy: Secondary | ICD-10-CM | POA: Insufficient documentation

## 2017-07-20 DIAGNOSIS — 419620001 Death: Secondary | SNOMED CT

## 2017-07-20 NOTE — ED Notes (Signed)
[facility] with CPR in progress-- pt had become apneic while in wheelchair after son assisted her into chair. Pt had been c/o back pain and nausea-- when EMS got to house, pt was apneic, CPR started. Pt received 5 Epi's, King airway in place.  On arrival, pt is pulseless, asystole on monitor, resp assisted,  Pt remained in asystole, CPR stopped at 0709 per Dr. Ethelda ChickJacubowitz.

## 2017-07-20 NOTE — ED Provider Notes (Addendum)
[facility] EMERGENCY DEPARTMENT Provider Note   CSN: [id number] Arrival date & time: August 09, 2017  8469   Level 5 caveat CPR in progress.  History obtained from paramedics  History   Chief Complaint No chief complaint on file. EMS was called for "sick call. ' First responders arrived find patient in PEA rhythm unresponsive, no vital signs.  CPR was initiated at 5:57 AM by first responders King airway was established by paramedics patient received epinephrine 4 mg venously at 6 AM, 6:05 AM, 6:10 AM at 6:15 AM.  Patient had return of spontaneous circulation at 6:15 AM.  She arrived here rhythm asystole.  Paramedics were unaware that she had lost her pulses upon arrival.  CPR started immediately upon arrival.  HPI Betty Keller is a 82 y.o. female.  HPI  Past Medical History:  Diagnosis Date  . Asthma   . BBB (bundle branch block)    left   . Esophagitis   . Fibrocystic breast   . GERD (gastroesophageal reflux disease)   . Hemidiaphragm paralysis   . History of obesity    lost 100 lbs  . Hyperlipidemia   . Hypertension   . Hypotonic bladder   . IBS (irritable bowel syndrome)   . Insomnia   . Osteoarthritis    shoulders   . Osteopenia   . Paresis (HCC)    rt. pelvis   . Premature ventricular contraction   . Rectocele    distal ; with outlet obstruction   . Thyroid disease    hypothyroidism  . Vitamin D deficiency     Patient Active Problem List   Diagnosis Date Noted  . Cellulitis of right lower extremity 01/29/2017  . Lymphedema 01/29/2017  . Hypothyroid 09/18/2013  . COPD (chronic obstructive pulmonary disease) (HCC) 06/27/2013  . Hyperlipidemia   . Unspecified vitamin D deficiency 01/24/2013  . Chronic venous insufficiency 11/15/2012  . GERD (gastroesophageal reflux disease) 10/13/2010  . HTN (hypertension) 10/13/2010  . IBS (irritable bowel syndrome) 10/13/2010  . Osteoarthritis of shoulder 10/13/2010  . Obesity 10/13/2010  . Rectocele  10/13/2010  . Insomnia 10/13/2010  . Bundle branch block, left 10/13/2010    Past Surgical History:  Procedure Laterality Date  . BREAST FIBROADENOMA SURGERY Right   . CATARACT EXTRACTION  1999  . EYE SURGERY    . l-s spine surgery  1980  . right carpal tunnel surgery x3  1996  . VAGINAL HYSTERECTOMY Bilateral   . VESICOVAGINAL FISTULA CLOSURE W/ TAH  1970   Dr. Perlie Gold      OB History    No data available       Home Medications    Prior to Admission medications   Medication Sig Start Date End Date Taking? Authorizing Provider  amoxicillin-clavulanate (AUGMENTIN) 875-125 MG tablet Take 1 tablet by mouth 2 (two) times daily. 06/14/17   Junie Spencer, FNP  buPROPion (WELLBUTRIN SR) 150 MG 12 hr tablet Take 1 tablet (150 mg total) by mouth 2 (two) times daily. 12/18/16   Dettinger, Elige Radon, MD  Cholecalciferol (VITAMIN D) 2000 UNITS CAPS Take 4,000 Units by mouth daily. One daily    [provider]  fluticasone (FLONASE) 50 MCG/ACT nasal spray Place 1 spray into both nostrils 2 (two) times daily as needed for allergies or rhinitis. 05/17/17   Dettinger, Elige Radon, MD  furosemide (LASIX) 40 MG tablet Take 1 tablet (40 mg total) by mouth daily. 12/02/16   Dettinger, Elige Radon, MD  lisinopril-hydrochlorothiazide Marcell Anger)  10-12.5 MG tablet Take 1 tablet by mouth daily. 12/02/16   Dettinger, Elige RadonJoshua A, MD  metolazone (ZAROXOLYN) 2.5 MG tablet Take one tablet on Monday, Wednesday and Friday 12/02/16   Dettinger, Elige RadonJoshua A, MD  Multiple Vitamins-Minerals (ICAPS AREDS 2 PO) Take by mouth.    [provider]  mupirocin ointment (BACTROBAN) 2 % Apply 1 application topically 3 (three) times daily. 12/02/16   Dettinger, Elige RadonJoshua A, MD  Naltrexone-Bupropion HCl ER (CONTRAVE) 8-90 MG TB12 Take 2 tablets by mouth 2 (two) times daily. 12/15/16   Dettinger, Elige RadonJoshua A, MD  nortriptyline (PAMELOR) 75 MG capsule TAKE 1 CAPSULE BY MOUTH ONCE DAILY AT BEDTIME 06/03/17   Dettinger,  Elige RadonJoshua A, MD  nystatin cream (MYCOSTATIN) Apply 1 application topically 2 (two) times daily. 05/17/17   Dettinger, Elige RadonJoshua A, MD  omeprazole (PRILOSEC) 20 MG capsule Take 1 capsule (20 mg total) by mouth daily. 02/11/17   Dettinger, Elige RadonJoshua A, MD  ranitidine (ZANTAC) 150 MG tablet Take 1 tablet (150 mg total) by mouth 2 (two) times daily. 12/10/14   Frederica KusterMiller, Stephen M, MD  silver sulfADIAZINE (SILVADENE) 1 % cream Apply 1 application topically daily. 01/28/17   Remus LofflerJones, Angel S, PA-C  simvastatin (ZOCOR) 40 MG tablet Take 1 tablet (40 mg total) by mouth daily with breakfast. 12/02/16   Dettinger, Elige RadonJoshua A, MD  sulfamethoxazole-trimethoprim (BACTRIM DS,SEPTRA DS) 800-160 MG tablet Take 1 tablet by mouth 2 (two) times daily. 03/09/17   Dettinger, Elige RadonJoshua A, MD  triamcinolone cream (KENALOG) 0.1 % Apply 1 application topically 2 (two) times daily. 01/06/17   Dettinger, Elige RadonJoshua A, MD    Family History Family History  Problem Relation Age of Onset  . Heart disease Mother        Unspecified.  Died age 80  . Breast cancer Mother   . Heart attack Father        Enlarged heart.    . Hyperlipidemia Brother   . Heart disease Brother   . Asthma Son   . Breast cancer Unknown        Family History     Social History Social History   Tobacco Use  . Smoking status: Former Smoker    Start date: 04/05/1972    Last attempt to quit: 07/20/1981    Years since quitting: 36.0  . Smokeless tobacco: Never Used  Substance Use Topics  . Alcohol use: No    Alcohol/week: 0.0 oz  . Drug use: No     Allergies   Alprazolam; Buspar [buspirone hcl]; and Lorazepam   Review of Systems Review of Systems  Unable to perform ROS: Other  Cardiac arrest   Physical Exam Updated Vital Signs There were no vitals taken for this visit. Pulses 0 spontaneous respiration 0 Physical Exam  Constitutional:  Unresponsive Glasgow Coma Score 3.  Chronically ill-appearing  HENT:  Head: Normocephalic and atraumatic.  King airway  in place  Eyes:  Pupils fixed and dilated.  Neck:  No signs of trauma  Cardiovascular:  Heart sounds absent  Pulmonary/Chest:  No signs of trauma, no spontaneous respirations  Abdominal:  Obese no signs of trauma  Musculoskeletal:  No signs of trauma.  No peripheral pulses  Neurological:  Unresponsive Glasgow Coma Score 3  Skin:  Cool and mottled  Psychiatric:  Unobtainable, patient unresponsive  Nursing note and vitals reviewed.    ED Treatments / Results  Labs (all labs ordered are listed, but only abnormal results are displayed) Labs Reviewed - No data to display  EKG  EKG Interpretation None       Radiology No results found.  Procedures Procedures (including critical care time)  Medications Ordered in ED Medications - No data to display   Initial Impression / Assessment and Plan / ED Course  I have reviewed the triage vital signs and the nursing notes.  Pertinent labs & imaging results that were available during my care of the patient were reviewed by me and considered in my medical decision making (see chart for details).     Rhythm confirmed to be asystole.  Patient pronounced dead by me 7:09 AM  Further history from patient's son who arrived subsequent to patient's being pronounced she had been getting progressively weaker for the past 3 days, to the point where she was unable to get out of the chair.  She complained of severe back pain last night and became less responsive immediately prior to calling 911 Son was notified of patient's death by me  I spoke with Paulene FloorMary Martin, NP Dr.Dettinger will sign death certificate Final Clinical Impressions(s) / ED Diagnoses  Diagnosis dead on arrival Final diagnoses:  None    ED Discharge Orders    None       Doug SouJacubowitz, Derric Dealmeida, MD 11/04/2016 [id number]    Doug SouJacubowitz, Anarely Nicholls, MD 11/04/2016 1725

## 2017-07-20 NOTE — Progress Notes (Signed)
   2017/03/06 0800  Clinical Encounter Type  Visited With Family  Visit Type Initial;ED;Death  Referral From Nurse  Spiritual Encounters  Spiritual Needs Grief support;Emotional  [facility] paged to offer grief, emotional and spiritual support for family following patient death; [facility] to obtain pertinent info on funeral home and contact information of [facility].

## 2017-07-20 NOTE — ED Notes (Signed)
See code narrator 

## 2017-07-20 DEATH — deceased

## 2017-10-22 IMAGING — CR DG TIBIA/FIBULA 2V*L*
2 series · 2 of 2 positions shown · non-contrast
Comparison: None.

CLINICAL DATA: Fall

EXAM:
LEFT TIBIA AND FIBULA - 2 VIEW

[view not recorded (1 of 2)]
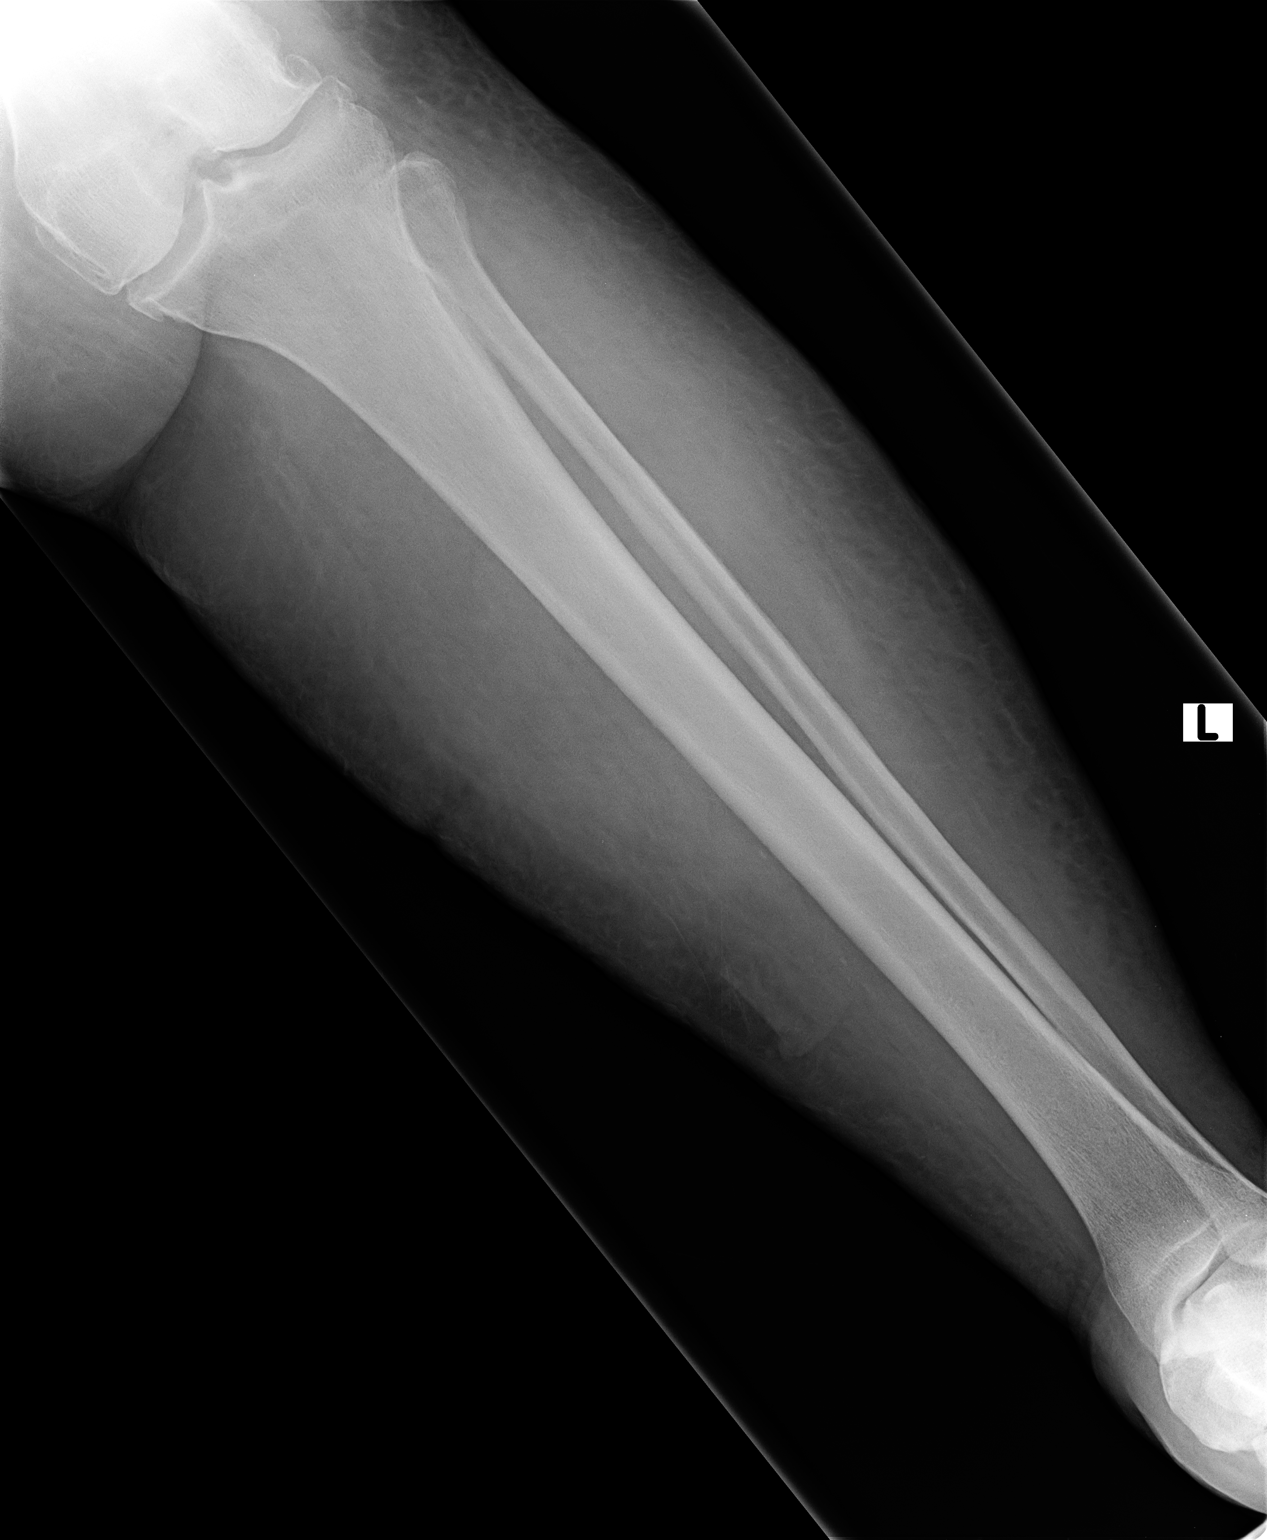

[view not recorded (2 of 2)]
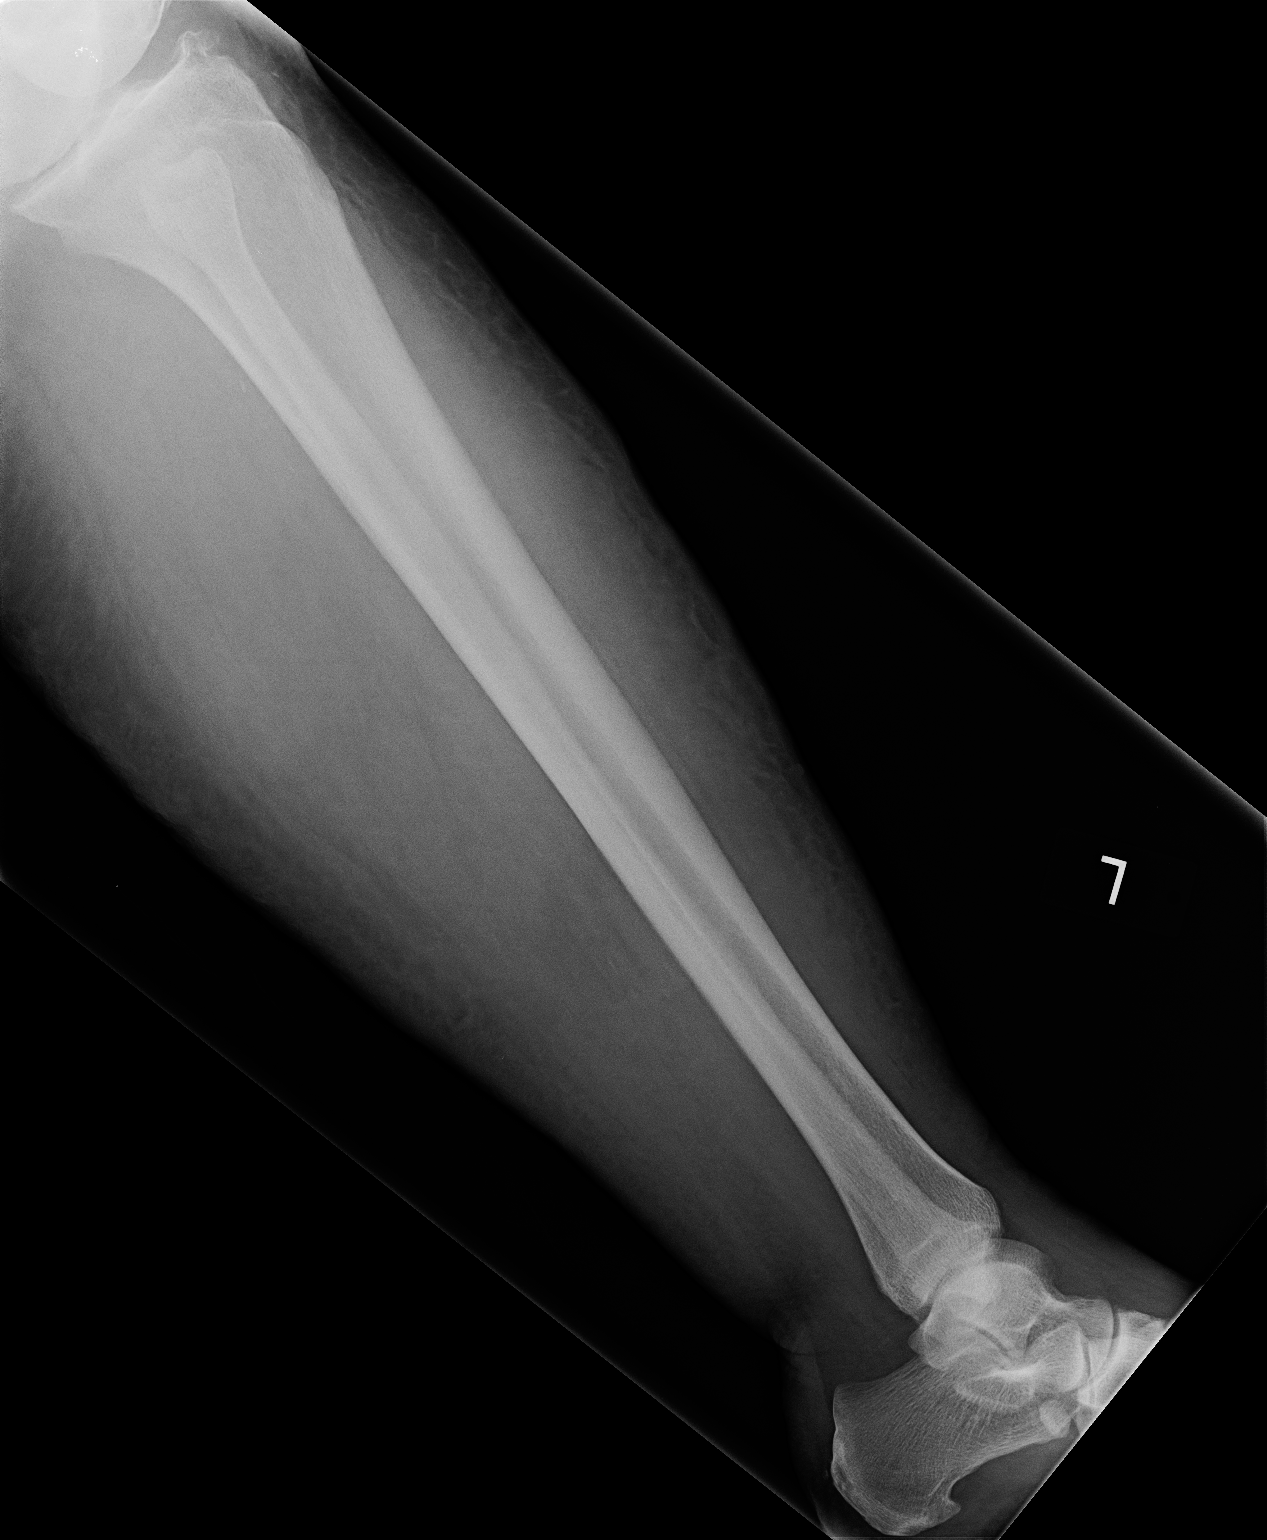

[2 of 2 positions shown; findings below may reference images not displayed]

FINDINGS: Degenerative changes within the left knee with joint space narrowing
and spurring. No acute bony abnormality. Specifically, no fracture,
subluxation, or dislocation. Soft tissues are intact.
IMPRESSION: No acute bony abnormality.

## 2017-11-16 ENCOUNTER — Ambulatory Visit: Payer: Medicare Other | Admitting: Family Medicine

## 2018-12-24 IMAGING — DX DG CHEST 2V
2 series · 2 of 2 positions shown · non-contrast
Comparison: 06/27/2013

CLINICAL DATA: Short of breath

EXAM:
CHEST  2 VIEW

[chest pa]
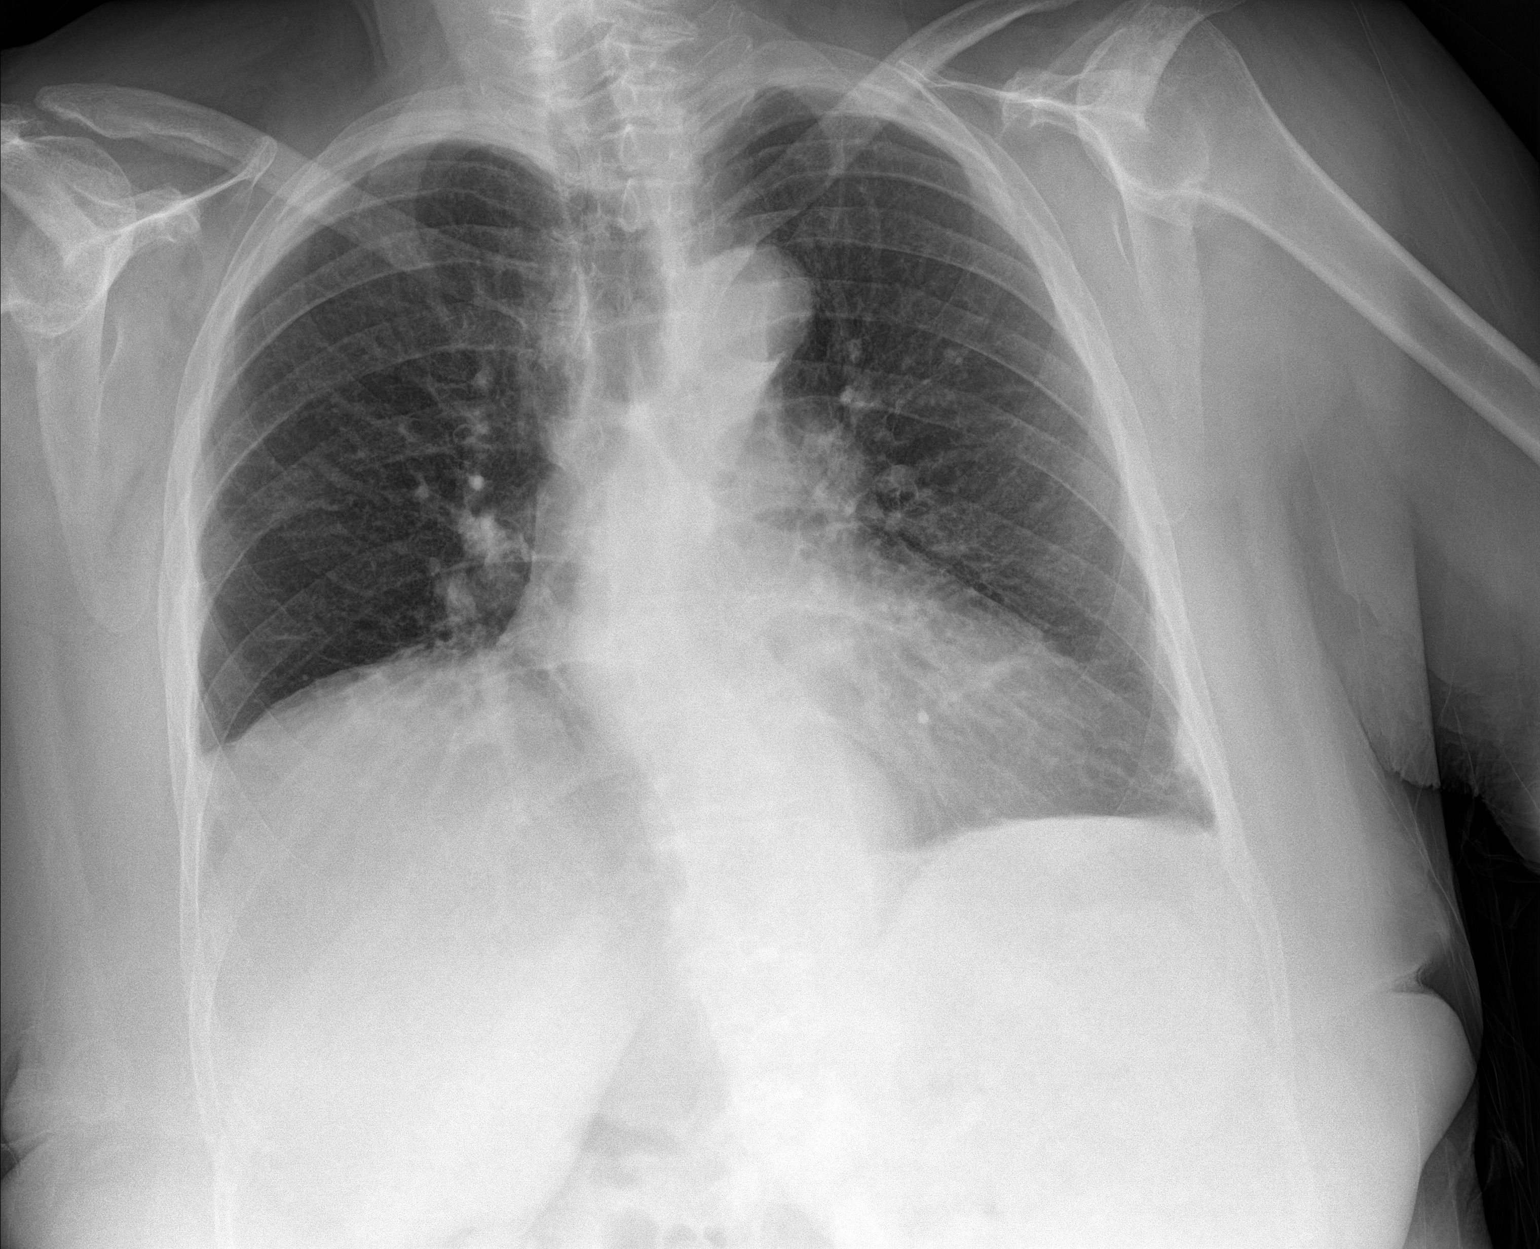

[chest lat]
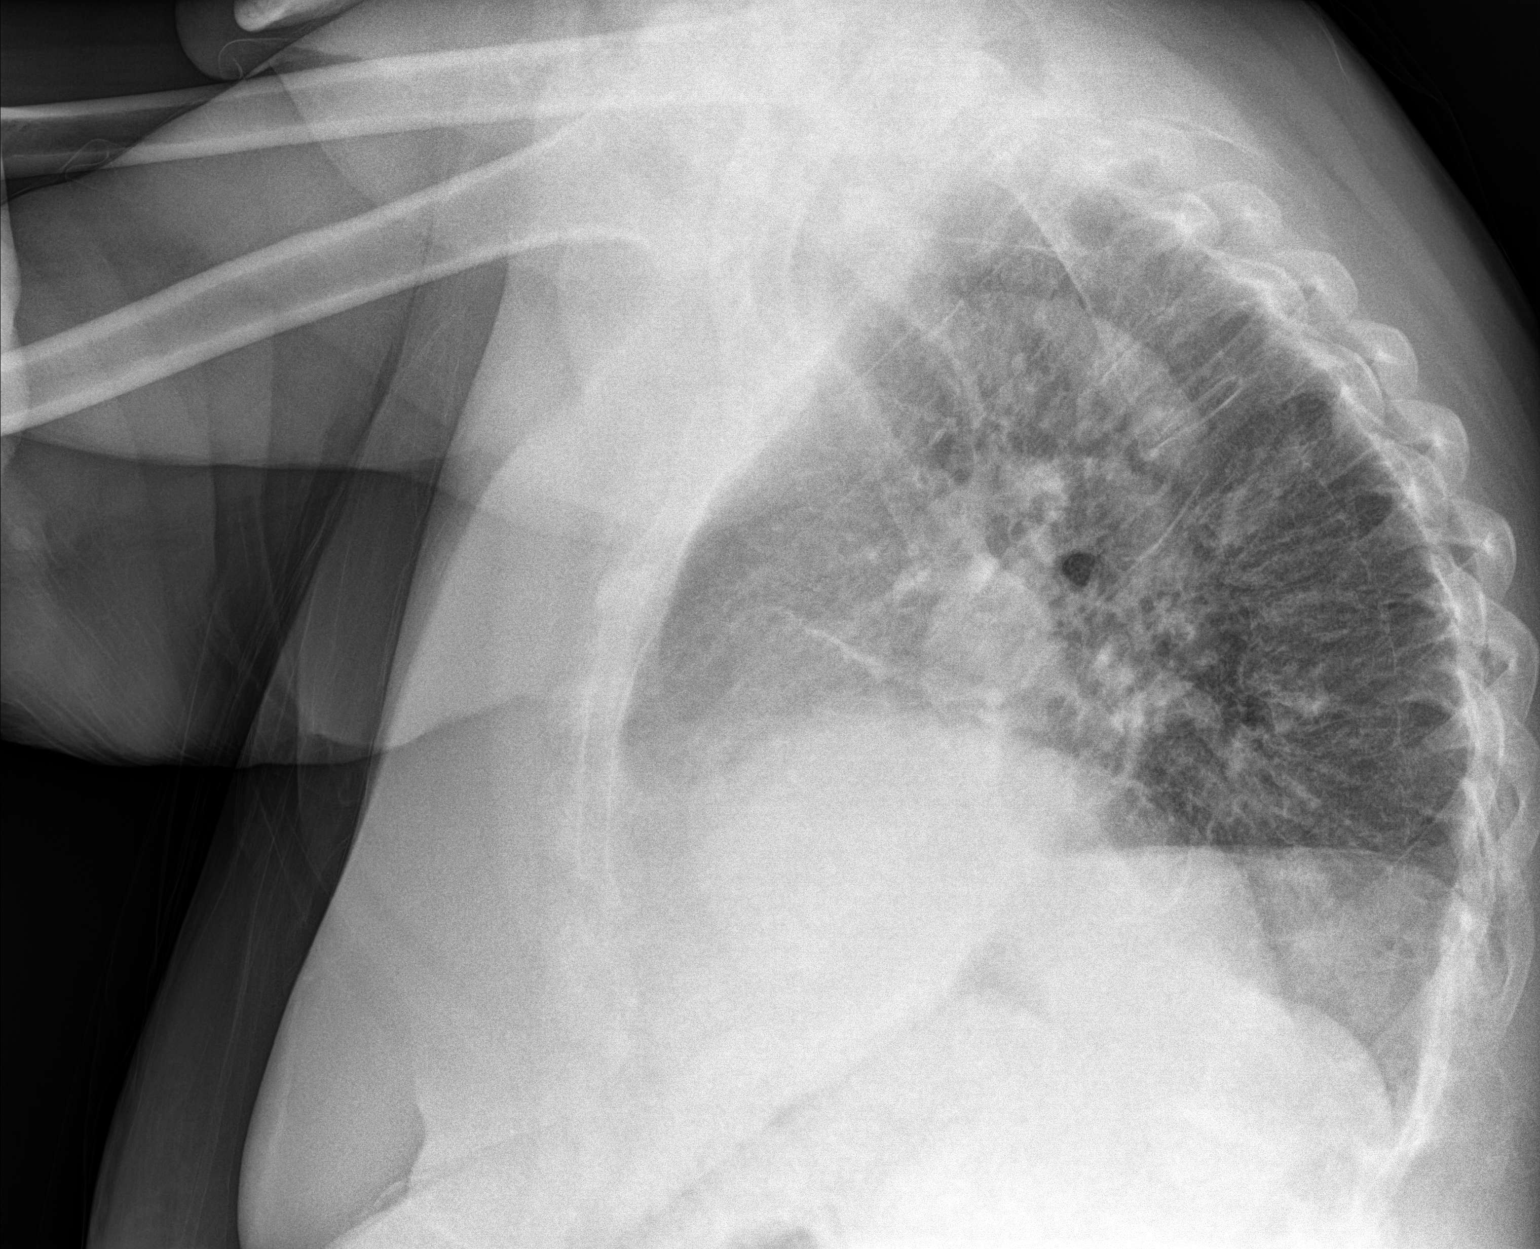

[2 of 2 positions shown; findings below may reference images not displayed]

FINDINGS: Chronic elevation right hemidiaphragm unchanged with right lower
lobe atelectasis/ scarring unchanged. Negative for heart failure or
pneumonia. No pleural effusion
IMPRESSION: Chronic elevation right hemidiaphragm. No acute cardiopulmonary
disease.
